# Patient Record
Sex: Female | Born: 1935 | Race: Asian | Hispanic: No | Smoking: Never smoker
Health system: Southern US, Community
[De-identification: ages and names within clinical notes are randomized; demographics above are authoritative.]

## PROBLEM LIST (undated history)

## (undated) DIAGNOSIS — Z9289 Personal history of other medical treatment: Secondary | ICD-10-CM

## (undated) DIAGNOSIS — R0789 Other chest pain: Secondary | ICD-10-CM

## (undated) DIAGNOSIS — E785 Hyperlipidemia, unspecified: Secondary | ICD-10-CM

## (undated) DIAGNOSIS — I10 Essential (primary) hypertension: Secondary | ICD-10-CM

## (undated) HISTORY — PX: DILATION AND CURETTAGE OF UTERUS: SHX78

## (undated) HISTORY — PX: TUBAL LIGATION: SHX77

## (undated) HISTORY — DX: Essential (primary) hypertension: I10

## (undated) HISTORY — DX: Personal history of other medical treatment: Z92.89

## (undated) HISTORY — DX: Hyperlipidemia, unspecified: E78.5

## (undated) HISTORY — DX: Other chest pain: R07.89

## (undated) HISTORY — PX: OTHER SURGICAL HISTORY: SHX169

---

## 2000-03-22 ENCOUNTER — Other Ambulatory Visit: Admission: RE | Admit: 2000-03-22 | Discharge: 2000-03-22 | Payer: Self-pay | Admitting: *Deleted

## 2000-03-23 ENCOUNTER — Encounter (INDEPENDENT_AMBULATORY_CARE_PROVIDER_SITE_OTHER): Payer: Self-pay | Admitting: Specialist

## 2000-03-23 ENCOUNTER — Other Ambulatory Visit: Admission: RE | Admit: 2000-03-23 | Discharge: 2000-03-23 | Payer: Self-pay | Admitting: *Deleted

## 2003-12-16 ENCOUNTER — Other Ambulatory Visit: Admission: RE | Admit: 2003-12-16 | Discharge: 2003-12-16 | Payer: Self-pay | Admitting: Internal Medicine

## 2003-12-18 ENCOUNTER — Ambulatory Visit (HOSPITAL_BASED_OUTPATIENT_CLINIC_OR_DEPARTMENT_OTHER): Admission: RE | Admit: 2003-12-18 | Discharge: 2003-12-18 | Payer: Self-pay | Admitting: Orthopedic Surgery

## 2003-12-18 ENCOUNTER — Encounter (INDEPENDENT_AMBULATORY_CARE_PROVIDER_SITE_OTHER): Payer: Self-pay | Admitting: Specialist

## 2003-12-18 ENCOUNTER — Ambulatory Visit (HOSPITAL_COMMUNITY): Admission: RE | Admit: 2003-12-18 | Discharge: 2003-12-18 | Payer: Self-pay | Admitting: Orthopedic Surgery

## 2006-03-04 ENCOUNTER — Other Ambulatory Visit: Admission: RE | Admit: 2006-03-04 | Discharge: 2006-03-04 | Payer: Self-pay | Admitting: Internal Medicine

## 2007-12-01 ENCOUNTER — Other Ambulatory Visit: Admission: RE | Admit: 2007-12-01 | Discharge: 2007-12-01 | Payer: Self-pay | Admitting: Internal Medicine

## 2007-12-28 ENCOUNTER — Ambulatory Visit (HOSPITAL_COMMUNITY): Admission: RE | Admit: 2007-12-28 | Discharge: 2007-12-28 | Payer: Self-pay | Admitting: *Deleted

## 2010-03-18 DIAGNOSIS — Z9289 Personal history of other medical treatment: Secondary | ICD-10-CM

## 2010-03-18 HISTORY — DX: Personal history of other medical treatment: Z92.89

## 2010-06-11 ENCOUNTER — Encounter: Admission: RE | Admit: 2010-06-11 | Discharge: 2010-06-11 | Payer: Self-pay | Admitting: Internal Medicine

## 2011-01-19 NOTE — Op Note (Signed)
Danielle [name], CHIHUAHUA NO.:  [id number]   MEDICAL RECORD NO.:  [id number]          PATIENT TYPE:  AMB   LOCATION:  ENDO                         FACILITY:  [facility]   PHYSICIAN:  Georgiana Spinner, M.D.    DATE OF BIRTH:  [birth year]   DATE OF PROCEDURE:  DATE OF DISCHARGE:                               OPERATIVE REPORT   PROCEDURE:  Colonoscopy   INDICATIONS:  Screening.   ANESTHESIA:  Fentanyl 50 mcg, Versed 4 mg.   PROCEDURE:  With the patient mildly sedated in the left lateral  decubitus position, the Pentax videoscopic pediatric colonoscope was  inserted in the rectum and passed under direct vision to cecum,  identified by ileocecal valve and appendiceal orifice, both of which  were photographed.  In the cecum where two AVMs.  From this point, the  colonoscope was slowly withdrawn taking circumferential views of the  colonic mucosa, stopping only then in the rectum which appeared normal  on direct and showed hemorrhoids in retroflexed view.  The endoscope was  straightened and withdrawn.  The patient's vital signs and pulse  oximeter remained stable.  The patient tolerated procedure well without  apparent complications.   FINDINGS:  AVMs of cecum.  Internal hemorrhoids, otherwise an  unremarkable exam.   PLAN:  Repeat examination in 5-10 years           ______________________________  Georgiana Spinner, M.D.     GMO/MEDQ  D:  12/28/2007  T:  12/28/2007  Job:  [id number]

## 2011-01-22 NOTE — Op Note (Signed)
[name], Danielle [name]                         ACCOUNT NO.:  [id number]   MEDICAL RECORD NO.:  [id number]                   PATIENT TYPE:  AMB   LOCATION:  DSC                                  FACILITY:  [facility]   PHYSICIAN:  Cindee Salt, M.D.                    DATE OF BIRTH:  04/13/1936   DATE OF PROCEDURE:  12/18/2003  DATE OF DISCHARGE:                                 OPERATIVE REPORT   PREOPERATIVE DIAGNOSIS:  Rupture EPL post distal radius fracture right  wrist.   POSTOPERATIVE DIAGNOSIS:  Rupture EPL post distal radius fracture right  wrist.   OPERATION:  EIP EPL transfer right wrist.   SURGEON:  Cindee Salt, M.D.   Threasa HeadsCarolyne Fiscal   ANESTHESIA:  General.   HISTORY:  The patient is a 75 year old female who suffered a nondisplaced  Colles fracture and was treated conservatively with cast immobilization.  This has gone on to heal.  However, she has had the extensor tendon to her  thumb IP joint rupture with inability to extend.   DESCRIPTION OF PROCEDURE:  The patient was brought to the operating room  where a general anesthetic was carried out without difficulty.  She was  prepped using DuraPrep in a supine position, right arm free.  A longitudinal  incision was made over the EPL tendon.  The rupture was palpable and  immediately apparent.  This was excised leaving the stump just proximal to  the extensor hood.  A transverse incision was then made over the metacarpal  phalangeal joint and carried down through the subcutaneous tissue.  The EIP  tendon was identified to the ulnar side of the extensor digitorum cominus.  This was transected distally and the stump repaired to the [facility].  This was  done with figure-of-eight 3-0 Mersilene sutures.  A separate transverse  incision was then made over the dorsum of the wrist just distal to the  Listers tubercle and this was carried down through the subcutaneous tissue.  Neurovascular structures were protected.  The dissection was  carried down to  the extensor retinaculum which was opened and partially removed to create a  tunnel.  The EIP muscle belly was identified.  A synovectomy was performed.  This allowed the tendon to be transferred from its distal stump proximal  into the more proximal wound.  A tunnel was then made over to the EPL tendon  and with a tendon passer, this was passed through the tunnel.  With a  Pulvertaft weave, this was woven through the stump of the EPL up to the  extensor hood.  Four weaves were performed and these were sutured into  position with figure-of-eight 3-0 Mersilene sutures.  The repair was done  with the wrist extended, the tendon pulled to full length, and the thumb in  a flexed position.  With volar flexion of the wrist, the thumb was entirely  pulled  up and maintained in a neutral position and a neutral position of the  wrist.  The wounds were copiously irrigated with saline.  The skin was  closed with interrupted 5-0 nylon suture.  A sterile compressive dressing  and splint to the thumb wrist was applied.  The patient tolerated the  procedure well and was taken to the recovery room for observation in  satisfactory condition.  She is discharged home to return to the [facility]  in [place] in one week on Vicodin and Keflex.                                              Cindee Salt, M.D.   GK/MEDQ  D:  12/18/2003  T:  12/18/2003  Job:  [id number]

## 2012-01-28 DIAGNOSIS — I10 Essential (primary) hypertension: Secondary | ICD-10-CM | POA: Diagnosis not present

## 2012-01-28 DIAGNOSIS — E7800 Pure hypercholesterolemia, unspecified: Secondary | ICD-10-CM | POA: Diagnosis not present

## 2012-02-02 DIAGNOSIS — Z0000 Encounter for general adult medical examination without abnormal findings: Secondary | ICD-10-CM | POA: Diagnosis not present

## 2012-02-02 DIAGNOSIS — M818 Other osteoporosis without current pathological fracture: Secondary | ICD-10-CM | POA: Diagnosis not present

## 2012-02-02 DIAGNOSIS — E7800 Pure hypercholesterolemia, unspecified: Secondary | ICD-10-CM | POA: Diagnosis not present

## 2012-02-02 DIAGNOSIS — I10 Essential (primary) hypertension: Secondary | ICD-10-CM | POA: Diagnosis not present

## 2012-02-09 DIAGNOSIS — Z1231 Encounter for screening mammogram for malignant neoplasm of breast: Secondary | ICD-10-CM | POA: Diagnosis not present

## 2012-03-01 ENCOUNTER — Other Ambulatory Visit (HOSPITAL_COMMUNITY)
Admission: RE | Admit: 2012-03-01 | Discharge: 2012-03-01 | Disposition: A | Discharge status: Home / Self Care | Payer: Medicare Other | Source: Ambulatory Visit | Attending: Internal Medicine | Admitting: Internal Medicine

## 2012-03-01 ENCOUNTER — Other Ambulatory Visit: Payer: Self-pay | Admitting: Registered Nurse

## 2012-03-01 DIAGNOSIS — Z23 Encounter for immunization: Secondary | ICD-10-CM | POA: Diagnosis not present

## 2012-03-01 DIAGNOSIS — Z1212 Encounter for screening for malignant neoplasm of rectum: Secondary | ICD-10-CM | POA: Diagnosis not present

## 2012-03-01 DIAGNOSIS — Z124 Encounter for screening for malignant neoplasm of cervix: Secondary | ICD-10-CM | POA: Diagnosis not present

## 2012-03-01 DIAGNOSIS — Z01419 Encounter for gynecological examination (general) (routine) without abnormal findings: Secondary | ICD-10-CM | POA: Diagnosis not present

## 2012-03-01 DIAGNOSIS — I10 Essential (primary) hypertension: Secondary | ICD-10-CM | POA: Diagnosis not present

## 2012-05-12 DIAGNOSIS — I10 Essential (primary) hypertension: Secondary | ICD-10-CM | POA: Diagnosis not present

## 2012-05-25 DIAGNOSIS — Z23 Encounter for immunization: Secondary | ICD-10-CM | POA: Diagnosis not present

## 2012-08-01 DIAGNOSIS — E7800 Pure hypercholesterolemia, unspecified: Secondary | ICD-10-CM | POA: Diagnosis not present

## 2012-08-01 DIAGNOSIS — M818 Other osteoporosis without current pathological fracture: Secondary | ICD-10-CM | POA: Diagnosis not present

## 2012-08-08 DIAGNOSIS — M810 Age-related osteoporosis without current pathological fracture: Secondary | ICD-10-CM | POA: Diagnosis not present

## 2012-08-08 DIAGNOSIS — E7800 Pure hypercholesterolemia, unspecified: Secondary | ICD-10-CM | POA: Diagnosis not present

## 2012-08-08 DIAGNOSIS — I10 Essential (primary) hypertension: Secondary | ICD-10-CM | POA: Diagnosis not present

## 2012-10-13 DIAGNOSIS — I10 Essential (primary) hypertension: Secondary | ICD-10-CM | POA: Diagnosis not present

## 2013-02-07 DIAGNOSIS — M810 Age-related osteoporosis without current pathological fracture: Secondary | ICD-10-CM | POA: Diagnosis not present

## 2013-02-07 DIAGNOSIS — E2839 Other primary ovarian failure: Secondary | ICD-10-CM | POA: Diagnosis not present

## 2013-02-07 DIAGNOSIS — I10 Essential (primary) hypertension: Secondary | ICD-10-CM | POA: Diagnosis not present

## 2013-02-14 DIAGNOSIS — I10 Essential (primary) hypertension: Secondary | ICD-10-CM | POA: Diagnosis not present

## 2013-02-14 DIAGNOSIS — M810 Age-related osteoporosis without current pathological fracture: Secondary | ICD-10-CM | POA: Diagnosis not present

## 2013-02-14 DIAGNOSIS — R7309 Other abnormal glucose: Secondary | ICD-10-CM | POA: Diagnosis not present

## 2013-02-14 DIAGNOSIS — E7800 Pure hypercholesterolemia, unspecified: Secondary | ICD-10-CM | POA: Diagnosis not present

## 2013-02-15 ENCOUNTER — Encounter: Payer: Self-pay | Admitting: Internal Medicine

## 2013-05-09 ENCOUNTER — Other Ambulatory Visit: Payer: Self-pay | Admitting: Internal Medicine

## 2013-05-09 NOTE — Telephone Encounter (Signed)
Rx was sent to pharmacy electronically. 

## 2013-05-14 DIAGNOSIS — I10 Essential (primary) hypertension: Secondary | ICD-10-CM | POA: Diagnosis not present

## 2013-05-14 DIAGNOSIS — R7309 Other abnormal glucose: Secondary | ICD-10-CM | POA: Diagnosis not present

## 2013-05-17 ENCOUNTER — Encounter: Payer: Self-pay | Admitting: *Deleted

## 2013-05-17 DIAGNOSIS — Z23 Encounter for immunization: Secondary | ICD-10-CM | POA: Diagnosis not present

## 2013-05-17 DIAGNOSIS — M810 Age-related osteoporosis without current pathological fracture: Secondary | ICD-10-CM | POA: Diagnosis not present

## 2013-05-17 DIAGNOSIS — I10 Essential (primary) hypertension: Secondary | ICD-10-CM | POA: Diagnosis not present

## 2013-05-17 DIAGNOSIS — E7800 Pure hypercholesterolemia, unspecified: Secondary | ICD-10-CM | POA: Diagnosis not present

## 2013-05-24 ENCOUNTER — Ambulatory Visit (INDEPENDENT_AMBULATORY_CARE_PROVIDER_SITE_OTHER): Payer: Medicare Other | Admitting: Internal Medicine

## 2013-05-24 ENCOUNTER — Encounter: Payer: Self-pay | Admitting: Internal Medicine

## 2013-05-24 VITALS — BP 122/76 | HR 78 | Ht 61.0 in | Wt 89.3 lb

## 2013-05-24 DIAGNOSIS — I10 Essential (primary) hypertension: Secondary | ICD-10-CM

## 2013-05-24 NOTE — Progress Notes (Signed)
OFFICE NOTE  Chief Complaint:  No complaints  Primary Care Physician: Pearson Grippe, MD  HPI:  Danielle [name] a pleasant 77 year old thin Asian lady who is quite active doing [employer] and many activities throughout the week. She had a complaint of chest heaviness at her last visit however that resolved and she's had no further episodes. She is continuing to be active and recently saw her primary care provider who obtained a lipid profile which was for the first time demonstrating a total cholesterol less than 200. She's had a couple more pounds weight loss but continues to be very active and her densitometry has stabilized with her activity. She denies any chest pain, palpitations or any other associated cardiac symptoms. Her blood pressure is at goal.  PMHx:  Past Medical History  Diagnosis Date  . Hypertension   . Atypical chest pain   . Dyslipidemia   . History of nuclear stress test 03/18/2010    dipyridamole; normal pattern of perfusion, low risk, normal study     Past Surgical History  Procedure Laterality Date  . Dilation and curettage of uterus      FAMHx:  Family History  Problem Relation Age of Onset  . Cancer Father   . Heart Problems Brother   . Hypertension Brother     SOCHx:   reports that she has never smoked. She does not have any smokeless tobacco history on file. She reports that she drinks about 0.6 ounces of alcohol per week. She reports that she does not use illicit drugs.  ALLERGIES:  Allergies  Allergen Reactions  . Other     Airborne    ROS: A comprehensive review of systems was negative.  HOME MEDS: Current Outpatient Prescriptions  Medication Sig Dispense Refill  . amLODipine (NORVASC) 5 MG tablet TAKE 1 TABLET BY MOUTH DAILY  90 tablet  1  . aspirin 81 MG tablet Take 81 mg by mouth daily.      Marland Kitchen b complex vitamins tablet Take 1 tablet by mouth daily.      Marland Kitchen CALCIUM PO Take by mouth 2 (two) times daily.      . Cholecalciferol  (VITAMIN D-3 PO) Take by mouth daily.      . fish oil-omega-3 fatty acids 1000 MG capsule Take 1 g by mouth 2 (two) times daily.      . Magnesium Hydroxide (MAGNESIA PO) Take by mouth at bedtime.      . Multiple Vitamins-Minerals (MULTIVITAMIN PO) Take by mouth daily.      . Pyridoxine HCl (VITAMIN B-6 PO) Take by mouth at bedtime.      . Red Yeast Rice Extract (RED YEAST RICE PO) Take by mouth 2 (two) times daily.       No current facility-administered medications for this visit.    LABS/IMAGING: No results found for this or any previous visit (from the past 48 hour(s)). No results found.  VITALS: BP 122/76  Pulse 78  Ht 5\' 1"  (1.549 m)  Wt 89 lb 4.8 oz (40.506 kg)  BMI 16.88 kg/m2  EXAM: General appearance: alert and no distress Neck: no adenopathy, no carotid bruit, no JVD, supple, symmetrical, trachea midline and thyroid not enlarged, symmetric, no tenderness/mass/nodules Lungs: clear to auscultation bilaterally Heart: regular rate and rhythm, S1, S2 normal, no murmur, click, rub or gallop Abdomen: soft, non-tender; bowel sounds normal; no masses,  no organomegaly Extremities: extremities normal, atraumatic, no cyanosis or edema Pulses: 2+ and symmetric Skin: Skin color, texture, turgor  normal. No rashes or lesions Neurologic: Grossly normal  EKG: Normal sinus rhythm at 78  ASSESSMENT: 1. Hypertension-controlled  PLAN: 1.   Danielle [name] is doing extremely well and continues to exercise. Her blood pressure is at goal on her current medications. She is having no adverse cardiac symptoms that I can identify. We'll plan on seeing her back in one year.  Chrystie Nose, MD, [facility] Attending Cardiologist [facility]  Tia Hieronymus C 05/24/2013, 10:29 AM

## 2013-11-06 DIAGNOSIS — S99919A Unspecified injury of unspecified ankle, initial encounter: Secondary | ICD-10-CM | POA: Diagnosis not present

## 2013-11-06 DIAGNOSIS — M25569 Pain in unspecified knee: Secondary | ICD-10-CM | POA: Diagnosis not present

## 2013-11-06 DIAGNOSIS — S8990XA Unspecified injury of unspecified lower leg, initial encounter: Secondary | ICD-10-CM | POA: Diagnosis not present

## 2013-11-14 ENCOUNTER — Other Ambulatory Visit: Payer: Self-pay | Admitting: Internal Medicine

## 2013-11-14 DIAGNOSIS — S86819A Strain of other muscle(s) and tendon(s) at lower leg level, unspecified leg, initial encounter: Secondary | ICD-10-CM | POA: Diagnosis not present

## 2013-11-14 DIAGNOSIS — S838X9A Sprain of other specified parts of unspecified knee, initial encounter: Secondary | ICD-10-CM | POA: Diagnosis not present

## 2013-11-14 DIAGNOSIS — M25569 Pain in unspecified knee: Secondary | ICD-10-CM | POA: Diagnosis not present

## 2013-11-14 NOTE — Telephone Encounter (Signed)
Rx was sent to pharmacy electronically. 

## 2013-12-17 DIAGNOSIS — M25569 Pain in unspecified knee: Secondary | ICD-10-CM | POA: Diagnosis not present

## 2013-12-24 DIAGNOSIS — M25569 Pain in unspecified knee: Secondary | ICD-10-CM | POA: Diagnosis not present

## 2013-12-31 DIAGNOSIS — M25569 Pain in unspecified knee: Secondary | ICD-10-CM | POA: Diagnosis not present

## 2013-12-31 DIAGNOSIS — IMO0002 Reserved for concepts with insufficient information to code with codable children: Secondary | ICD-10-CM | POA: Diagnosis not present

## 2014-01-14 DIAGNOSIS — IMO0002 Reserved for concepts with insufficient information to code with codable children: Secondary | ICD-10-CM | POA: Diagnosis not present

## 2014-01-29 DIAGNOSIS — IMO0002 Reserved for concepts with insufficient information to code with codable children: Secondary | ICD-10-CM | POA: Diagnosis not present

## 2014-01-29 DIAGNOSIS — Y9301 Activity, walking, marching and hiking: Secondary | ICD-10-CM | POA: Diagnosis not present

## 2014-01-29 DIAGNOSIS — M9420 Chondromalacia, unspecified site: Secondary | ICD-10-CM | POA: Diagnosis not present

## 2014-01-29 DIAGNOSIS — M23305 Other meniscus derangements, unspecified medial meniscus, unspecified knee: Secondary | ICD-10-CM | POA: Diagnosis not present

## 2014-01-29 DIAGNOSIS — G8918 Other acute postprocedural pain: Secondary | ICD-10-CM | POA: Diagnosis not present

## 2014-01-29 DIAGNOSIS — Y998 Other external cause status: Secondary | ICD-10-CM | POA: Diagnosis not present

## 2014-01-29 DIAGNOSIS — Y929 Unspecified place or not applicable: Secondary | ICD-10-CM | POA: Diagnosis not present

## 2014-01-29 DIAGNOSIS — M959 Acquired deformity of musculoskeletal system, unspecified: Secondary | ICD-10-CM | POA: Diagnosis not present

## 2014-01-29 DIAGNOSIS — X500XXA Overexertion from strenuous movement or load, initial encounter: Secondary | ICD-10-CM | POA: Diagnosis not present

## 2014-02-04 DIAGNOSIS — Z5189 Encounter for other specified aftercare: Secondary | ICD-10-CM | POA: Diagnosis not present

## 2014-02-06 DIAGNOSIS — Z5189 Encounter for other specified aftercare: Secondary | ICD-10-CM | POA: Diagnosis not present

## 2014-02-11 DIAGNOSIS — Z5189 Encounter for other specified aftercare: Secondary | ICD-10-CM | POA: Diagnosis not present

## 2014-02-14 DIAGNOSIS — Z5189 Encounter for other specified aftercare: Secondary | ICD-10-CM | POA: Diagnosis not present

## 2014-02-18 DIAGNOSIS — Z5189 Encounter for other specified aftercare: Secondary | ICD-10-CM | POA: Diagnosis not present

## 2014-02-21 DIAGNOSIS — Z5189 Encounter for other specified aftercare: Secondary | ICD-10-CM | POA: Diagnosis not present

## 2014-02-25 DIAGNOSIS — Z5189 Encounter for other specified aftercare: Secondary | ICD-10-CM | POA: Diagnosis not present

## 2014-02-28 DIAGNOSIS — Z5189 Encounter for other specified aftercare: Secondary | ICD-10-CM | POA: Diagnosis not present

## 2014-03-04 DIAGNOSIS — Z5189 Encounter for other specified aftercare: Secondary | ICD-10-CM | POA: Diagnosis not present

## 2014-04-10 DIAGNOSIS — I10 Essential (primary) hypertension: Secondary | ICD-10-CM | POA: Diagnosis not present

## 2014-04-10 DIAGNOSIS — M818 Other osteoporosis without current pathological fracture: Secondary | ICD-10-CM | POA: Diagnosis not present

## 2014-04-10 DIAGNOSIS — E7800 Pure hypercholesterolemia, unspecified: Secondary | ICD-10-CM | POA: Diagnosis not present

## 2014-04-10 DIAGNOSIS — M810 Age-related osteoporosis without current pathological fracture: Secondary | ICD-10-CM | POA: Diagnosis not present

## 2014-04-10 DIAGNOSIS — R7309 Other abnormal glucose: Secondary | ICD-10-CM | POA: Diagnosis not present

## 2014-04-17 DIAGNOSIS — I10 Essential (primary) hypertension: Secondary | ICD-10-CM | POA: Diagnosis not present

## 2014-04-17 DIAGNOSIS — E7800 Pure hypercholesterolemia, unspecified: Secondary | ICD-10-CM | POA: Diagnosis not present

## 2014-04-17 DIAGNOSIS — M810 Age-related osteoporosis without current pathological fracture: Secondary | ICD-10-CM | POA: Diagnosis not present

## 2014-05-14 ENCOUNTER — Encounter: Payer: Self-pay | Admitting: Internal Medicine

## 2014-05-14 ENCOUNTER — Ambulatory Visit (INDEPENDENT_AMBULATORY_CARE_PROVIDER_SITE_OTHER): Payer: Medicare Other | Admitting: Internal Medicine

## 2014-05-14 VITALS — BP 130/82 | HR 77 | Ht 60.0 in | Wt 91.6 lb

## 2014-05-14 DIAGNOSIS — I10 Essential (primary) hypertension: Secondary | ICD-10-CM | POA: Diagnosis not present

## 2014-05-14 MED ORDER — AMLODIPINE BESYLATE 5 MG PO TABS: ORAL_TABLET | ORAL | Status: DC

## 2014-05-14 NOTE — Progress Notes (Signed)
OFFICE NOTE  Chief Complaint:  No complaints  Primary Care Physician: Pearson Grippe, MD  HPI:  Danielle [name] a pleasant 78 year old thin Asian lady who is quite active doing [employer] and many activities throughout the week. She had a complaint of chest heaviness at her last visit however that resolved and she's had no further episodes. She is continuing to be active.  Unfortunately, she recently suffered a torn left meniscus in her knee. She subsequently underwent arthroscopic surgery and has had marked improvement in her exercise ability. She underwent physical therapy and is now getting back to normal. Blood pressure remains well-controlled on low-dose amlodipine.  PMHx:  Past Medical History  Diagnosis Date  . Hypertension   . Atypical chest pain   . Dyslipidemia   . History of nuclear stress test 03/18/2010    dipyridamole; normal pattern of perfusion, low risk, normal study     Past Surgical History  Procedure Laterality Date  . Dilation and curettage of uterus      FAMHx:  Family History  Problem Relation Age of Onset  . Cancer Father   . Heart Problems Brother   . Hypertension Brother     SOCHx:   reports that she has never smoked. She does not have any smokeless tobacco history on file. She reports that she drinks about .6 ounces of alcohol per week. She reports that she does not use illicit drugs.  ALLERGIES:  Allergies  Allergen Reactions  . Other     Airborne    ROS: A comprehensive review of systems was negative.  HOME MEDS: Current Outpatient Prescriptions  Medication Sig Dispense Refill  . amLODipine (NORVASC) 5 MG tablet TAKE 1 TABLET BY MOUTH DAILY  90 tablet  1  . aspirin 81 MG tablet Take 81 mg by mouth daily.      Marland Kitchen b complex vitamins tablet Take 1 tablet by mouth daily.      Marland Kitchen CALCIUM PO Take by mouth 2 (two) times daily.      . Cholecalciferol (VITAMIN D-3 PO) Take by mouth daily.      . fish oil-omega-3 fatty acids 1000 MG  capsule Take 1 g by mouth 2 (two) times daily.      . Glucosamine HCl (GLUCOSAMINE PO) Take by mouth daily.      . Magnesium Hydroxide (MAGNESIA PO) Take by mouth at bedtime.      . Multiple Vitamins-Minerals (MULTIVITAMIN PO) Take by mouth daily.      . Pyridoxine HCl (VITAMIN B-6 PO) Take by mouth at bedtime.      . Red Yeast Rice Extract (RED YEAST RICE PO) Take by mouth 2 (two) times daily.       No current facility-administered medications for this visit.    LABS/IMAGING: No results found for this or any previous visit (from the past 48 hour(s)). No results found.  VITALS: BP 130/82  Pulse 77  Ht 5' (1.524 m)  Wt 91 lb 9.6 oz (41.549 kg)  BMI 17.89 kg/m2  EXAM: General appearance: alert and no distress Neck: no adenopathy, no carotid bruit, no JVD, supple, symmetrical, trachea midline and thyroid not enlarged, symmetric, no tenderness/mass/nodules Lungs: clear to auscultation bilaterally Heart: regular rate and rhythm, S1, S2 normal, no murmur, click, rub or gallop Abdomen: soft, non-tender; bowel sounds normal; no masses,  no organomegaly Extremities: extremities normal, atraumatic, no cyanosis or edema Pulses: 2+ and symmetric Skin: Skin color, texture, turgor normal. No rashes or lesions Neurologic: Grossly  normal  EKG: Normal sinus rhythm at 77, non-specific T wave changes  ASSESSMENT: 1. Hypertension-controlled  PLAN: 1.   [name] continues to do well with good blood pressure control and low-dose amlodipine. She recently had arthroscopic knee surgery and is now well recovered. I encourage her to get back to her walking exercises. She recently also had blood work indicating excellent control of her cholesterol normal blood glucose. 179, triglycerides 61, HDL 56 and LDL 111. Plan to see her back yearly or sooner as necessary.  Chrystie Nose, MD, [facility] Attending Cardiologist [facility]  Cyril Railey C 05/14/2014, 3:12 PM

## 2014-05-14 NOTE — Patient Instructions (Signed)
Your physician wants you to follow-up in: 1 year. You will receive a reminder letter in the mail two months in advance. If you don't receive a letter, please call our office to schedule the follow-up appointment.  

## 2014-06-01 DIAGNOSIS — Z23 Encounter for immunization: Secondary | ICD-10-CM | POA: Diagnosis not present

## 2014-07-30 ENCOUNTER — Ambulatory Visit (INDEPENDENT_AMBULATORY_CARE_PROVIDER_SITE_OTHER): Payer: Medicare Other | Admitting: Family Medicine

## 2014-07-30 VITALS — BP 122/80 | HR 79 | Temp 97.3°F | Resp 16 | Ht 61.0 in | Wt 92.0 lb

## 2014-07-30 DIAGNOSIS — R350 Frequency of micturition: Secondary | ICD-10-CM

## 2014-07-30 DIAGNOSIS — R1030 Lower abdominal pain, unspecified: Secondary | ICD-10-CM

## 2014-07-30 DIAGNOSIS — R109 Unspecified abdominal pain: Secondary | ICD-10-CM

## 2014-07-30 DIAGNOSIS — R300 Dysuria: Secondary | ICD-10-CM

## 2014-07-30 LAB — POCT UA - MICROSCOPIC ONLY
Bacteria, U Microscopic: NEGATIVE
Casts, Ur, LPF, POC: NEGATIVE
Epithelial cells, urine per micros: NEGATIVE
Mucus, UA: NEGATIVE
WBC, Ur, HPF, POC: NEGATIVE
Yeast, UA: NEGATIVE

## 2014-07-30 LAB — POCT CBC
Granulocyte percent: 67 %G (ref 37–80)
HCT, POC: 40.8 % (ref 37.7–47.9)
Hemoglobin: 13.7 g/dL (ref 12.2–16.2)
Lymph, poc: 1.3 (ref 0.6–3.4)
MCH, POC: 32.2 pg — AB (ref 27–31.2)
MCHC: 33.7 g/dL (ref 31.8–35.4)
MCV: 95.8 fL (ref 80–97)
MID (cbc): 0.2 (ref 0–0.9)
MPV: 5.9 fL (ref 0–99.8)
POC Granulocyte: 3 (ref 2–6.9)
POC LYMPH PERCENT: 28.8 %L (ref 10–50)
POC MID %: 4.2 %M (ref 0–12)
Platelet Count, POC: 287 10*3/uL (ref 142–424)
RBC: 4.27 M/uL (ref 4.04–5.48)
RDW, POC: 13.3 %
WBC: 4.5 10*3/uL — AB (ref 4.6–10.2)

## 2014-07-30 LAB — POCT URINALYSIS DIPSTICK
Bilirubin, UA: NEGATIVE
Glucose, UA: NEGATIVE
Ketones, UA: NEGATIVE
Leukocytes, UA: NEGATIVE
Nitrite, UA: NEGATIVE
Protein, UA: NEGATIVE
Spec Grav, UA: <=1.005
Urobilinogen, UA: 0.2
pH, UA: 6

## 2014-07-30 LAB — COMPREHENSIVE METABOLIC PANEL
ALT: 22 U/L (ref 0–35)
AST: 29 U/L (ref 0–37)
Albumin: 4.9 g/dL (ref 3.5–5.2)
Alkaline Phosphatase: 85 U/L (ref 39–117)
BUN: 14 mg/dL (ref 6–23)
CO2: 27 mEq/L (ref 19–32)
Calcium: 9.5 mg/dL (ref 8.4–10.5)
Chloride: 101 mEq/L (ref 96–112)
Creat: 0.68 mg/dL (ref 0.50–1.10)
Glucose, Bld: 107 mg/dL — ABNORMAL HIGH (ref 70–99)
Potassium: 4.7 mEq/L (ref 3.5–5.3)
Sodium: 137 mEq/L (ref 135–145)
Total Bilirubin: 0.6 mg/dL (ref 0.2–1.2)
Total Protein: 7.5 g/dL (ref 6.0–8.3)

## 2014-07-30 NOTE — Progress Notes (Signed)
Subjective: 78 year old lady who is here with about a 5 day history of some discomfort and cramping in her low abdomen. She has not noticed any odor or cloudiness of her urine. She has not had any vaginal bleeding. She had a little low back pain yesterday. She has not had any documented fevers or felt particularly chilled. She only rarely has had UTIs, probably over 10 or 15 years ago. She is retired. She does have nocturia for 4- 5 times  but that is not anything either.  Objective: Healthy-appearing lady. No CVA tenderness. Abdomen soft with mild suprapubic tenderness  Results for orders placed or performed in visit on 07/30/14  POCT UA - Microscopic Only  Result Value Ref Range   WBC, Ur, HPF, POC neg    RBC, urine, microscopic 0-1    Bacteria, U Microscopic neg    Mucus, UA neg    Epithelial cells, urine per micros neg    Crystals, Ur, HPF, POC ne    Casts, Ur, LPF, POC neg    Yeast, UA neg   POCT urinalysis dipstick  Result Value Ref Range   Color, UA pale yellow    Clarity, UA clear    Glucose, UA neg    Bilirubin, UA neg    Ketones, UA neg    Spec Grav, UA <=1.005    Blood, UA trace-intact    pH, UA 6.0    Protein, UA neg    Urobilinogen, UA 0.2    Nitrite, UA neg    Leukocytes, UA Negative    With normal urinalysis decided to go ahead and do a pelvic exam. Normal external genitalia. Vaginal mucosa and cervix appear normal. Digital exam only was would allow one finger. Could not feel any masses or tenderness. There is a little stool up in the colon the left lower quadrant but did not feel like it was an ovarian or pelvic mass.  We'll check CBC and C met. If she is not doing better in the next few days she will need further imaging, probably a pelvic ultrasound or a CT scan of abdomen and pelvis.  Results for orders placed or performed in visit on 07/30/14  POCT UA - Microscopic Only  Result Value Ref Range   WBC, Ur, HPF, POC neg    RBC, urine, microscopic 0-1    Bacteria, U Microscopic neg    Mucus, UA neg    Epithelial cells, urine per micros neg    Crystals, Ur, HPF, POC ne    Casts, Ur, LPF, POC neg    Yeast, UA neg   POCT urinalysis dipstick  Result Value Ref Range   Color, UA pale yellow    Clarity, UA clear    Glucose, UA neg    Bilirubin, UA neg    Ketones, UA neg    Spec Grav, UA <=1.005    Blood, UA trace-intact    pH, UA 6.0    Protein, UA neg    Urobilinogen, UA 0.2    Nitrite, UA neg    Leukocytes, UA Negative   POCT CBC  Result Value Ref Range   WBC 4.5 (A) 4.6 - 10.2 K/uL   Lymph, poc 1.3 0.6 - 3.4   POC LYMPH PERCENT 28.8 10 - 50 %L   MID (cbc) 0.2 0 - 0.9   POC MID % 4.2 0 - 12 %M   POC Granulocyte 3.0 2 - 6.9   Granulocyte percent 67.0 37 - 80 %G  RBC 4.27 4.04 - 5.48 M/uL   Hemoglobin 13.7 12.2 - 16.2 g/dL   HCT, POC 40.8 37.7 - 47.9 %   MCV 95.8 80 - 97 fL   MCH, POC 32.2 (A) 27 - 31.2 pg   MCHC 33.7 31.8 - 35.4 g/dL   RDW, POC 13.3 %   Platelet Count, POC 287 142 - 424 K/uL   MPV 5.9 0 - 99.8 fL

## 2014-07-30 NOTE — Patient Instructions (Signed)
If pain gets specifically worse we should reassess you and probably will need to get either an ultrasound or a CT scan of the abdomen. However, as of now, no specific etiology is found for your pain. I would suggest that even though your bowels act every day, take a dose of MiraLAX for a day or 2 or 3 to get sure stools loosened up a little bit and see if that helps calm things down. You can get pain just from a little stool being backup continue even when the bowels are acting regularly.  Take ibuprofen or Tylenol as needed for pain.

## 2014-08-16 DIAGNOSIS — H52203 Unspecified astigmatism, bilateral: Secondary | ICD-10-CM | POA: Diagnosis not present

## 2015-04-29 ENCOUNTER — Other Ambulatory Visit: Payer: Self-pay | Admitting: Internal Medicine

## 2015-04-29 NOTE — Telephone Encounter (Signed)
REFILL 

## 2015-05-08 ENCOUNTER — Encounter: Payer: Self-pay | Admitting: Internal Medicine

## 2015-05-29 ENCOUNTER — Encounter: Payer: Self-pay | Admitting: Internal Medicine

## 2015-05-29 ENCOUNTER — Ambulatory Visit (INDEPENDENT_AMBULATORY_CARE_PROVIDER_SITE_OTHER): Payer: Medicare Other | Admitting: Internal Medicine

## 2015-05-29 VITALS — BP 130/78 | HR 82 | Ht 60.0 in | Wt 91.0 lb

## 2015-05-29 DIAGNOSIS — I10 Essential (primary) hypertension: Secondary | ICD-10-CM

## 2015-05-29 NOTE — Progress Notes (Signed)
OFFICE NOTE  Chief Complaint:  No complaints  Primary Care Physician: Pearson Grippe, MD  HPI:  Danielle [name] a pleasant 79 year old thin Asian lady who is quite active doing [employer] and many activities throughout the week. She had a complaint of chest heaviness at her last visit however that resolved and she's had no further episodes. She is continuing to be active.  Unfortunately, she recently suffered a torn left meniscus in her knee. She subsequently underwent arthroscopic surgery and has had marked improvement in her exercise ability. She underwent physical therapy and is now getting back to normal. Blood pressure remains well-controlled on low-dose amlodipine.   I had the pleasure of seeing Danielle [name] back in the office today.  She continues to do extremely well. She exercises regularly and denies any chest pain or worsening shortness of breath. Blood pressures been well controlled.  I reviewed recent laboratory work from her primary care provider which shows excellent cholesterol control and otherwise all labs are within normal limits.  PMHx:  Past Medical History  Diagnosis Date  . Hypertension   . Atypical chest pain   . Dyslipidemia   . History of nuclear stress test 03/18/2010    dipyridamole; normal pattern of perfusion, low risk, normal study     Past Surgical History  Procedure Laterality Date  . Dilation and curettage of uterus      FAMHx:  Family History  Problem Relation Age of Onset  . Cancer Father   . Heart Problems Brother   . Hypertension Brother     SOCHx:   reports that she has never smoked. She does not have any smokeless tobacco history on file. She reports that she drinks about 0.6 oz of alcohol per week. She reports that she does not use illicit drugs.  ALLERGIES:  Allergies  Allergen Reactions  . Other     Airborne    ROS: A comprehensive review of systems was negative.  HOME MEDS: Current Outpatient Prescriptions    Medication Sig Dispense Refill  . amLODipine (NORVASC) 5 MG tablet TAKE 1 TABLET BY MOUTH DAILY 90 tablet 0  . aspirin 81 MG tablet Take 81 mg by mouth daily.    Marland Kitchen b complex vitamins tablet Take 1 tablet by mouth daily.    Marland Kitchen CALCIUM PO Take by mouth 2 (two) times daily.    . Cholecalciferol (VITAMIN D-3 PO) Take by mouth daily.    . fish oil-omega-3 fatty acids 1000 MG capsule Take 1 g by mouth 2 (two) times daily.    . Magnesium Hydroxide (MAGNESIA PO) Take by mouth at bedtime.    . Multiple Vitamins-Minerals (MULTIVITAMIN PO) Take by mouth daily.    . Pyridoxine HCl (VITAMIN B-6 PO) Take by mouth at bedtime.    . Red Yeast Rice Extract (RED YEAST RICE PO) Take by mouth 2 (two) times daily.     No current facility-administered medications for this visit.    LABS/IMAGING: No results found for this or any previous visit (from the past 48 hour(s)). No results found.  VITALS: BP 130/78 mmHg  Pulse 82  Ht 5' (1.524 m)  Wt 91 lb (41.277 kg)  BMI 17.77 kg/m2  EXAM: General appearance: alert and no distress Neck: no adenopathy, no carotid bruit, no JVD, supple, symmetrical, trachea midline and thyroid not enlarged, symmetric, no tenderness/mass/nodules Lungs: clear to auscultation bilaterally Heart: regular rate and rhythm, S1, S2 normal, no murmur, click, rub or gallop Abdomen: soft, non-tender; bowel sounds  normal; no masses,  no organomegaly Extremities: extremities normal, atraumatic, no cyanosis or edema Pulses: 2+ and symmetric Skin: Skin color, texture, turgor normal. No rashes or lesions Neurologic: Grossly normal  EKG: Normal sinus rhythm at 82  ASSESSMENT: 1. Hypertension-controlled  PLAN: 1.   Danielle [name] continues to do well with good blood pressure control and low-dose amlodipine.  She continues to exercise regularly and is completely asymptomatic. We'll continue with her current regimen otherwise plan to see her back annually or sooner as necessary.  Chrystie Nose, MD, [facility] Attending Cardiologist [facility]   Chrystie Nose 05/29/2015, 1:42 PM

## 2015-05-29 NOTE — Patient Instructions (Signed)
Your physician recommends that you schedule a follow-up appointment in: ONE YEAR 

## 2015-08-05 ENCOUNTER — Other Ambulatory Visit: Payer: Self-pay | Admitting: Internal Medicine

## 2015-08-06 NOTE — Telephone Encounter (Signed)
Rx request sent to pharmacy.  

## 2015-11-26 ENCOUNTER — Other Ambulatory Visit: Payer: Self-pay | Admitting: Internal Medicine

## 2015-11-26 NOTE — Telephone Encounter (Signed)
Ann( Dr. Selena BattenKim Office) is calling

## 2015-11-26 NOTE — Telephone Encounter (Signed)
Ann from Dr. Elmyra RicksKim's office called in on patient's behalf after patient was notified to call her PCP by someone in our practice. Patient told Dewayne Hatchnn she had called in to our office and wanted to see Dr. Rennis GoldenHilty today for elevated BP and chest discomfort but was instead just given an appointment with Franky [place]uke, [place] on Friday March 24th -- the message was never triaged. Dewayne Hatchnn states that the patient was "exasperated" regarding the communication from our office. Patient is not currently on the line for direct contact. Informed Ann that the appt notes for the 3/24 appt only state "BP elevated"   Called patient - patient states she is taking generic of amlodipine - her Rx from [facility] looks different than previous [facility] (insurance dictated a change in her pharmacy) -- patient felt funny with this different amlodipine - heart was pounding -- she took the med for 12 days and then stopped, states her BP was going up -- BP was 160s/90s (off amlodipine) -- patient drank beet juice to lower BP -- patient had an old diuretic from years past that she took yesterday (hctz 12.5mg ) -- patient found some leftovers of her old amlodipine - she states she will take this  Patient asked if she [facility] - advised I will send in for her - she is aware she may pay out of pocket since pharmacy is not in her network with insurance Advised patient to resume amlodipine and not take old hctz prescription  Informed her I will send the message to the MD for review.

## 2015-11-26 NOTE — Telephone Encounter (Signed)
Rx request sent to pharmacy.  

## 2015-11-27 NOTE — Telephone Encounter (Signed)
Thanks .Marland Kitchen. We need to have Amy look into how Danielle [name] was handled over the phone. Agree with her getting meds from her old pharmacy, but she should be seen if BP is up. Please reassure her that Franky MachoLuke will take care of her.  Dr. HRexene Edison

## 2015-11-27 NOTE — Telephone Encounter (Signed)
Patient cancelled her 11/28/15 appt with Franky MachoLuke, [place].

## 2015-11-28 ENCOUNTER — Ambulatory Visit: Payer: Medicare Other | Admitting: Cardiology

## 2015-12-02 ENCOUNTER — Encounter: Payer: Self-pay | Admitting: Internal Medicine

## 2015-12-02 ENCOUNTER — Ambulatory Visit: Payer: Medicare Other | Admitting: Internal Medicine

## 2015-12-02 ENCOUNTER — Ambulatory Visit (INDEPENDENT_AMBULATORY_CARE_PROVIDER_SITE_OTHER): Payer: Medicare Other | Admitting: Internal Medicine

## 2015-12-02 VITALS — BP 144/82 | HR 78 | Ht 61.0 in | Wt 92.0 lb

## 2015-12-02 DIAGNOSIS — R079 Chest pain, unspecified: Secondary | ICD-10-CM

## 2015-12-02 DIAGNOSIS — R072 Precordial pain: Secondary | ICD-10-CM

## 2015-12-02 DIAGNOSIS — R002 Palpitations: Secondary | ICD-10-CM | POA: Diagnosis not present

## 2015-12-02 DIAGNOSIS — R0989 Other specified symptoms and signs involving the circulatory and respiratory systems: Secondary | ICD-10-CM | POA: Insufficient documentation

## 2015-12-02 DIAGNOSIS — I10 Essential (primary) hypertension: Secondary | ICD-10-CM

## 2015-12-02 NOTE — Telephone Encounter (Signed)
Patient is scheduled to see Dr. Rennis GoldenHilty 12/02/15 @ 1030am

## 2015-12-02 NOTE — Patient Instructions (Signed)
Your physician has requested that you have an exercise tolerance test. For further information please visit [email or web address]. Please also follow instruction sheet, as given.  Your physician has recommended that you wear an event monitor for 1 week. Event monitors are medical devices that record the heart's electrical activity. Doctors most often us these monitors to diagnose arrhythmias. Arrhythmias are problems with the speed or rhythm of the heartbeat. The monitor is a small, portable device. You can wear one while you do your normal daily activities. This is usually used to diagnose what is causing palpitations/syncope (passing out).  Your physician recommends that you schedule a follow-up appointment after your testing.

## 2015-12-02 NOTE — Progress Notes (Signed)
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 She had a complaint of chest heaviness at her last visit however that resolved and she's had no further episodes. She is continuing to be active.  Unfortunately, she recently suffered a torn left meniscus in her knee. She subsequently underwent arthroscopic surgery and has had marked improvement in her exercise ability. She underwent physical therapy and is now getting back to normal. Blood pressure remains well-controlled on low-dose amlodipine.  I had the pleasure of seeing Danielle [name] back in the office today.  She continues to do extremely well. She exercises regularly and denies any chest pain or worsening shortness of breath. Blood pressures been well controlled.  I reviewed recent laboratory work from her primary care provider which shows excellent cholesterol control and otherwise all labs are within normal limits.  At the pleasure see Danielle [name] here today in follow-up. She recent called in the office and noted that she's had some labile blood pressures. [facility]. When they filled her prescription she noted that she had a different generic for amlodipine. She said after taking that for 8-10 doses she felt some tightness in her chest, palpitations and noted her blood pressure gone up to 180. She then stopped the medicine and took some of her old prescription that she found. She called the office and requested a prescription for her old medication. She then switched to that with an improvement in blood pressure however she still has some symptoms and doesn't feel quite as well as she did several weeks ago. She reported the palpitations felt like a hard pounding in her chest. Her last stress test was in 2011 and negative  for ischemia.  PMHx:  Past Medical History  Diagnosis Date  . Hypertension   . Atypical chest pain   . Dyslipidemia   . History of nuclear stress test 03/18/2010    dipyridamole; normal pattern of perfusion, low risk, normal study     Past Surgical History  Procedure Laterality Date  . Dilation and curettage of uterus      FAMHx:  Family History  Problem Relation Age of Onset  . Cancer Father   . Heart Problems Brother   . Hypertension Brother     SOCHx:   reports that she has never smoked. She does not have any smokeless tobacco history on file. She reports that she drinks about 0.6 oz of alcohol per week. She reports that she does not use illicit drugs.  ALLERGIES:  Allergies  Allergen Reactions  . Other     Airborne    ROS: Pertinent items noted in HPI and remainder of comprehensive ROS otherwise negative.  HOME MEDS: Current Outpatient Prescriptions  Medication Sig Dispense Refill  . amLODipine (NORVASC) 5 MG tablet TAKE 1 TABLET BY MOUTH DAILY 90 tablet 1  . aspirin 81 MG tablet Take 81 mg by mouth daily.    Marland Kitchen. b complex vitamins tablet Take 1 tablet by mouth daily.    Marland Kitchen. CALCIUM PO Take by mouth 2 (two) times daily.    . Cholecalciferol (VITAMIN D-3 PO) Take by mouth daily.    . fish oil-omega-3 fatty acids 1000 MG capsule Take 1 g by mouth 2 (two) times daily.    . Magnesium Hydroxide (MAGNESIA PO)  Take by mouth at bedtime.    . Multiple Vitamins-Minerals (MULTIVITAMIN PO) Take by mouth daily.    . Pyridoxine HCl (VITAMIN B-6 PO) Take by mouth at bedtime.    . Red Yeast Rice Extract (RED YEAST RICE PO) Take by mouth 2 (two) times daily.     No current facility-administered medications for this visit.    LABS/IMAGING: No results found for this or any previous visit (from the past 48 hour(s)). No results found.  VITALS: BP 144/82 mmHg  Pulse 78  Ht  (1.549 m)  Wt 92 lb (41.731 kg)  BMI 17.39 kg/m2  EXAM: General appearance: alert and no  distress Neck: no adenopathy, no carotid bruit, no JVD, supple, symmetrical, trachea midline and thyroid not enlarged, symmetric, no tenderness/mass/nodules Lungs: clear to auscultation bilaterally Heart: regular rate and rhythm, S1, S2 normal, no murmur, click, rub or gallop Abdomen: soft, non-tender; bowel sounds normal; no masses,  no organomegaly Extremities: extremities normal, atraumatic, no cyanosis or edema Pulses: 2+ and symmetric Skin: Skin color, texture, turgor normal. No rashes or lesions Neurologic: Grossly normal  EKG: Normal sinus rhythm at 79  ASSESSMENT: 1. Chest pain 2. Palpitations 3. Hypertension-controlled  PLAN: 1.   Danielle [name] had an unexplained episode where her blood pressure went up and she felt some chest pain and palpitations. She attributed this to changing generic medications. Seems kind of unusual but I guess could be a reaction to some of the fillers in the medication. She since gone [facility] which actually is [facility] than [facility] prescription from [facility]. I'm concerned that this event may been related to something else other than change in medication. She's not had a coronary artery assessment and in number of years and recommend an exercise treadmill stress test. This also could have been related to palpitations or perhaps something like atrial fibrillation. I like to place a one-week monitor CV can pick up on any arrhythmias. For now will continue her current dose of amlodipine. Blood pressure is high normal today. Plan to see her back in follow-up in a few weeks.  Chrystie Nose, MD, [facility] Attending Cardiologist [facility]  [facility] 12/02/2015, 1:04 PM

## 2015-12-05 ENCOUNTER — Ambulatory Visit: Payer: Medicare Other | Admitting: Internal Medicine

## 2015-12-23 ENCOUNTER — Telehealth (HOSPITAL_COMMUNITY): Payer: Self-pay

## 2015-12-23 NOTE — Telephone Encounter (Signed)
Encounter complete. 

## 2015-12-25 ENCOUNTER — Ambulatory Visit (HOSPITAL_COMMUNITY)
Admission: RE | Admit: 2015-12-25 | Discharge: 2015-12-25 | Disposition: A | Discharge status: Home / Self Care | Payer: Medicare Other | Source: Ambulatory Visit | Attending: Internal Medicine | Admitting: Internal Medicine

## 2015-12-25 DIAGNOSIS — I10 Essential (primary) hypertension: Secondary | ICD-10-CM | POA: Diagnosis not present

## 2015-12-25 DIAGNOSIS — R002 Palpitations: Secondary | ICD-10-CM | POA: Insufficient documentation

## 2015-12-25 DIAGNOSIS — R079 Chest pain, unspecified: Secondary | ICD-10-CM | POA: Insufficient documentation

## 2015-12-25 DIAGNOSIS — R0989 Other specified symptoms and signs involving the circulatory and respiratory systems: Secondary | ICD-10-CM

## 2015-12-25 LAB — EXERCISE TOLERANCE TEST
CHL CUP STRESS STAGE 1 GRADE: 0 %
CHL CUP STRESS STAGE 1 HR: 78 {beats}/min
CHL CUP STRESS STAGE 2 SPEED: 1 mph
CHL CUP STRESS STAGE 3 SPEED: 1 mph
CHL CUP STRESS STAGE 4 DBP: 87 mmHg
CHL CUP STRESS STAGE 4 GRADE: 10 %
CHL CUP STRESS STAGE 4 SPEED: 1.7 mph
CHL CUP STRESS STAGE 5 GRADE: 12 %
CHL CUP STRESS STAGE 5 SPEED: 2.5 mph
CHL CUP STRESS STAGE 6 GRADE: 0 %
CHL CUP STRESS STAGE 6 HR: 123 {beats}/min
CHL CUP STRESS STAGE 6 SPEED: 0 mph
CHL CUP STRESS STAGE 7 DBP: 85 mmHg
CHL CUP STRESS STAGE 7 GRADE: 0 %
CHL CUP STRESS STAGE 7 HR: 83 {beats}/min
CHL CUP STRESS STAGE 7 SPEED: 0 mph
Estimated workload: 7 METS
Exercise duration (min): 5 min
MPHR: 142 {beats}/min
Peak BP: 198 mmHg
Peak HR: 139 {beats}/min
Percent HR: 99 %
Percent of predicted max HR: 97 %
RPE: 16
Rest HR: 81 {beats}/min
Stage 1 DBP: 101 mmHg
Stage 1 SBP: 158 mmHg
Stage 1 Speed: 0 mph
Stage 2 Grade: 0 %
Stage 2 HR: 88 {beats}/min
Stage 3 Grade: 0.1 %
Stage 3 HR: 88 {beats}/min
Stage 4 HR: 121 {beats}/min
Stage 4 SBP: 187 mmHg
Stage 5 DBP: 83 mmHg
Stage 5 HR: 139 {beats}/min
Stage 5 SBP: 198 mmHg
Stage 6 DBP: 81 mmHg
Stage 6 SBP: 202 mmHg
Stage 7 SBP: 142 mmHg

## 2015-12-29 ENCOUNTER — Ambulatory Visit (INDEPENDENT_AMBULATORY_CARE_PROVIDER_SITE_OTHER): Payer: Medicare Other

## 2015-12-29 DIAGNOSIS — R002 Palpitations: Secondary | ICD-10-CM | POA: Diagnosis not present

## 2016-01-13 ENCOUNTER — Telehealth: Payer: Self-pay | Admitting: Internal Medicine

## 2016-01-13 NOTE — Telephone Encounter (Signed)
New message   Pt wants rn to call her about results of her monitor

## 2016-01-13 NOTE — Telephone Encounter (Signed)
Patient called with monitor results - in EPIC  Patient scheduled to see Dr. Rennis GoldenHilty 01/23/16

## 2016-01-23 ENCOUNTER — Ambulatory Visit: Payer: Medicare Other | Admitting: Internal Medicine

## 2016-04-20 DIAGNOSIS — H52223 Regular astigmatism, bilateral: Secondary | ICD-10-CM | POA: Diagnosis not present

## 2016-04-29 DIAGNOSIS — I10 Essential (primary) hypertension: Secondary | ICD-10-CM | POA: Diagnosis not present

## 2016-04-29 DIAGNOSIS — M810 Age-related osteoporosis without current pathological fracture: Secondary | ICD-10-CM | POA: Diagnosis not present

## 2016-04-29 DIAGNOSIS — E559 Vitamin D deficiency, unspecified: Secondary | ICD-10-CM | POA: Diagnosis not present

## 2016-05-05 DIAGNOSIS — J329 Chronic sinusitis, unspecified: Secondary | ICD-10-CM | POA: Diagnosis not present

## 2016-05-05 DIAGNOSIS — E7800 Pure hypercholesterolemia, unspecified: Secondary | ICD-10-CM | POA: Diagnosis not present

## 2016-05-05 DIAGNOSIS — I10 Essential (primary) hypertension: Secondary | ICD-10-CM | POA: Diagnosis not present

## 2016-05-05 DIAGNOSIS — Z23 Encounter for immunization: Secondary | ICD-10-CM | POA: Diagnosis not present

## 2016-05-05 DIAGNOSIS — Z0000 Encounter for general adult medical examination without abnormal findings: Secondary | ICD-10-CM | POA: Diagnosis not present

## 2016-05-11 ENCOUNTER — Ambulatory Visit (INDEPENDENT_AMBULATORY_CARE_PROVIDER_SITE_OTHER): Payer: Medicare Other | Admitting: Internal Medicine

## 2016-05-26 ENCOUNTER — Other Ambulatory Visit: Payer: Self-pay | Admitting: Internal Medicine

## 2016-06-11 ENCOUNTER — Encounter: Payer: Self-pay | Admitting: Internal Medicine

## 2016-06-11 ENCOUNTER — Ambulatory Visit (INDEPENDENT_AMBULATORY_CARE_PROVIDER_SITE_OTHER): Payer: Medicare Other | Admitting: Internal Medicine

## 2016-06-11 VITALS — BP 125/76 | HR 78 | Ht 59.0 in | Wt 88.0 lb

## 2016-06-11 DIAGNOSIS — I10 Essential (primary) hypertension: Secondary | ICD-10-CM

## 2016-06-11 DIAGNOSIS — R002 Palpitations: Secondary | ICD-10-CM

## 2016-06-11 DIAGNOSIS — R072 Precordial pain: Secondary | ICD-10-CM

## 2016-06-11 NOTE — Progress Notes (Signed)
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 She had a complaint of chest heaviness at her last visit however that resolved and she's had no further episodes. She is continuing to be active.  Unfortunately, she recently suffered a torn left meniscus in her knee. She subsequently underwent arthroscopic surgery and has had marked improvement in her exercise ability. She underwent physical therapy and is now getting back to normal. Blood pressure remains well-controlled on low-dose amlodipine.  I had the pleasure of seeing Mrs. Nobis back in the office today.  She continues to do extremely well. She exercises regularly and denies any chest pain or worsening shortness of breath. Blood pressures been well controlled.  I reviewed recent laboratory work from her primary care provider which shows excellent cholesterol control and otherwise all labs are within normal limits.  At the pleasure see Mrs. Januszewski here today in follow-up. She recent called in the office and noted that she's had some labile blood pressures. [facility]. When they filled her prescription she noted that she had a different generic for amlodipine. She said after taking that for 8-10 doses she felt some tightness in her chest, palpitations and noted her blood pressure gone up to 180. She then stopped the medicine and took some of her old prescription that she found. She called the office and requested a prescription for her old medication. She then switched to that with an improvement in blood pressure however she still has some symptoms and doesn't feel quite as well as she did several weeks ago. She reported the palpitations felt like a hard pounding in her chest. Her last stress test was in 2011 and negative  for ischemia.  06/11/2016  Mrs. Neas returns today for follow-up. In April she had some palpitations and discomfort in her cst. She underwent a repea ischemia. She wore a monitor which showed some occasional PACs and PVCs but no A. Fib or other arrhythmias. [facility] and has noted no problems with her generic amlodipine. Blood pressure is well-controlled today.  PMHx:  Past Medical History:  Diagnosis Date  . Atypical chest pain   . Dyslipidemia   . History of nuclear stress test 03/18/2010   dipyridamole; normal pattern of perfusion, low risk, normal study   . Hypertension     Past Surgical History:  Procedure Laterality Date  . DILATION AND CURETTAGE OF UTERUS      FAMHx:  Family History  Problem Relation Age of Onset  . Cancer Father   . Heart Problems Brother   . Hypertension Brother     SOCHx:   reports that she has never smoked. She does not have any smokeless tobacco history on file. She reports that she drinks about 0.6 oz of alcohol per week . She reports that she does not use drugs.  ALLERGIES:  Allergies  Allergen Reactions  . Other     Airborne    ROS: Pertinent items noted in HPI and remainder of comprehensive ROS otherwise negative.  HOME MEDS: Current Outpatient Prescriptions  Medication Sig Dispense Refill  . amLODipine (NORVASC) 5 MG tablet TAKE 1 TABLET BY MOUTH DAILY 90 tablet 1  . aspirin 81 MG tablet Take 81 mg by mouth daily.    .Marland Kitchen  b complex vitamins tablet Take 1 tablet by mouth daily.    Marland Kitchen CALCIUM PO Take by mouth 2 (two) times daily.    . Cholecalciferol (VITAMIN D-3 PO) Take by mouth daily.    . fish oil-omega-3 fatty acids 1000 MG capsule Take 1 g by mouth 2 (two) times daily.    . Magnesium Hydroxide (MAGNESIA PO) Take by mouth at bedtime.    . Multiple Vitamins-Minerals (MULTIVITAMIN PO) Take by mouth daily.    . Pyridoxine HCl (VITAMIN B-6 PO) Take by mouth at bedtime.    . Red Yeast Rice Extract (RED YEAST RICE  PO) Take by mouth 2 (two) times daily.     No current facility-administered medications for this visit.     LABS/IMAGING: No results found for this or any previous visit (from the past 48 hour(s)). No results found.  VITALS: BP 125/76   Pulse 78   Ht 4\' 11"  (1.499 m)   Wt 88 lb (39.9 kg)   BMI 17.77 kg/m   EXAM: General appearance: alert and no distress Neck: no adenopathy, no carotid bruit, no JVD, supple, symmetrical, trachea midline and thyroid not enlarged, symmetric, no tenderness/mass/nodules Lungs: clear to auscultation bilaterally Heart: regular rate and rhythm, S1, S2 normal, no murmur, click, rub or gallop Abdomen: soft, non-tender; bowel sounds normal; no masses,  no organomegaly Extremities: extremities normal, atraumatic, no cyanosis or edema Pulses: 2+ and symmetric Skin: Skin color, texture, turgor normal. No rashes or lesions Neurologic: Grossly normal  EKG: Normal sinus rhythm at 78, minimal voltage criteria for LVH  ASSESSMENT: 1. Chest pain - low risk GXT 12/2015 2. Palpitations - occasional PAC's/PVC's 3. Hypertension-controlled  PLAN: 1.   Mrs. Brazel is doing well without palpitations. Her blood pressure is at goal. She's had no further chest pain and had a low risk exercise treadmill stress test earlier this year. Overall she's doing well and I would recommend follow-up annually or sooner as necessary.  Chrystie Nose, MD, [facility] Attending Cardiologist [facility]  Chrystie Nose 06/11/2016, 9:49 AM

## 2016-06-11 NOTE — Patient Instructions (Signed)

## 2016-10-28 DIAGNOSIS — I10 Essential (primary) hypertension: Secondary | ICD-10-CM | POA: Diagnosis not present

## 2016-10-28 DIAGNOSIS — R739 Hyperglycemia, unspecified: Secondary | ICD-10-CM | POA: Diagnosis not present

## 2016-11-04 DIAGNOSIS — R739 Hyperglycemia, unspecified: Secondary | ICD-10-CM | POA: Diagnosis not present

## 2016-11-04 DIAGNOSIS — E7800 Pure hypercholesterolemia, unspecified: Secondary | ICD-10-CM | POA: Diagnosis not present

## 2016-11-04 DIAGNOSIS — Z0000 Encounter for general adult medical examination without abnormal findings: Secondary | ICD-10-CM | POA: Diagnosis not present

## 2016-11-04 DIAGNOSIS — I10 Essential (primary) hypertension: Secondary | ICD-10-CM | POA: Diagnosis not present

## 2016-11-04 DIAGNOSIS — K047 Periapical abscess without sinus: Secondary | ICD-10-CM | POA: Diagnosis not present

## 2016-11-18 ENCOUNTER — Other Ambulatory Visit: Payer: Self-pay | Admitting: Internal Medicine

## 2016-11-20 ENCOUNTER — Other Ambulatory Visit: Payer: Self-pay | Admitting: Internal Medicine

## 2017-06-11 ENCOUNTER — Ambulatory Visit (INDEPENDENT_AMBULATORY_CARE_PROVIDER_SITE_OTHER): Payer: Medicare Other | Admitting: Family Medicine

## 2017-06-11 ENCOUNTER — Encounter: Payer: Self-pay | Admitting: Family Medicine

## 2017-06-11 VITALS — BP 124/70 | HR 73 | Temp 98.6°F | Resp 16 | Ht 60.0 in | Wt 86.4 lb

## 2017-06-11 DIAGNOSIS — J0101 Acute recurrent maxillary sinusitis: Secondary | ICD-10-CM

## 2017-06-11 MED ORDER — CHLORHEXIDINE GLUCONATE 0.12 % MT SOLN: 15.0000 mL | Freq: Every day | OROMUCOSAL | 1 refills | Status: DC

## 2017-06-11 MED ORDER — LEVOFLOXACIN 500 MG PO TABS: 500.0000 mg | ORAL_TABLET | Freq: Every day | ORAL | 0 refills | Status: DC

## 2017-06-11 NOTE — Patient Instructions (Signed)

## 2017-06-11 NOTE — Progress Notes (Signed)
Subjective:  By signing my name below, I, Stann Ore, attest that this documentation has been prepared under the direction and in the presence of Norberto Sorenson, MD. Electronically Signed: Stann Ore, Scribe. 06/11/2017 , 11:22 AM .  Patient was seen in Room 1 .   Patient ID: Danielle [name], female    DOB: 02/09/1936, 81 y.o.   MRN: [id number] Chief Complaint  Patient presents with  . Sinus Problem    possible due to root canal   HPI Danielle [name] is a 81 y.o. female who presents to Primary Care at [facility] complaining of sinus problem (R>L) with foul odor/smell and some pain ongoing since her root canal done 2 years ago. She believes her root canal was done too close to her sinus cavity, and her PCP, Dr. Selena Batten, prescribed amoxicillin for about 3 weeks. She was also referred to ENT over a year ago, who only prescribed her a stronger antibiotic, which she doesn't recall what it was. She has used neti pot with temporary relief. When she has the occasional tooth pain, she applies oral gel to the area. She hasn't tried antibiotic mouthwash before. She denies knowledge of antibiotic allergies. Her last antibiotic dose was about a month ago. She denies any change in taste, fever, chills or sore throat.   Past Medical History:  Diagnosis Date  . Atypical chest pain   . Dyslipidemia   . History of nuclear stress test 03/18/2010   dipyridamole; normal pattern of perfusion, low risk, normal study   . Hypertension    Prior to Admission medications   Medication Sig Start Date End Date Taking? Authorizing Provider  amLODipine (NORVASC) 5 MG tablet Take 1 tablet (5 mg total) by mouth daily. 11/18/16   Hilty, Lisette Abu, MD  amLODipine (NORVASC) 5 MG tablet TAKE 1 TABLET BY MOUTH DAILY 11/22/16   Hilty, Lisette Abu, MD  aspirin 81 MG tablet Take 81 mg by mouth daily.    [provider]  b complex vitamins tablet Take 1 tablet by mouth daily.    [provider]  CALCIUM PO Take by mouth 2  (two) times daily.    [provider]  Cholecalciferol (VITAMIN D-3 PO) Take by mouth daily.    [provider]  fish oil-omega-3 fatty acids 1000 MG capsule Take 1 g by mouth 2 (two) times daily.    [provider]  Magnesium Hydroxide (MAGNESIA PO) Take by mouth at bedtime.    [provider]  Multiple Vitamins-Minerals (MULTIVITAMIN PO) Take by mouth daily.    [provider]  Pyridoxine HCl (VITAMIN B-6 PO) Take by mouth at bedtime.    [provider]  Red Yeast Rice Extract (RED YEAST RICE PO) Take by mouth 2 (two) times daily.    [provider]   Allergies  Allergen Reactions  . Other     Airborne   Past Surgical History:  Procedure Laterality Date  . DILATION AND CURETTAGE OF UTERUS     Family History  Problem Relation Age of Onset  . Cancer Father   . Heart Problems Brother   . Hypertension Brother    Social History   Social History  . Marital status: Divorced    Spouse name: N/A  . Number of children: N/A  . Years of education: N/A   Social History Main Topics  . Smoking status: Never Smoker  . Smokeless tobacco: Never Used  . Alcohol use 0.6 oz/week    1 Glasses of  wine per week     Comment: 1-2 QHS  . Drug use: No  . Sexual activity: Not Asked   Other Topics Concern  . None   Social History Narrative   Exercises at [employer]. Meditation & yoga.    Depression screen PHQ 2/9 06/11/2017  Decreased Interest 0  Down, Depressed, Hopeless 0  PHQ - 2 Score 0    Review of Systems  Constitutional: Negative for chills, fatigue, fever and unexpected weight change.  HENT: Positive for dental problem. Negative for sinus pain, sinus pressure and sore throat.        Foul odor/smell  Respiratory: Negative for cough.   Gastrointestinal: Negative for constipation, diarrhea, nausea and vomiting.  Skin: Negative for rash and wound.  Neurological: Negative for dizziness, weakness and headaches.        Objective:   Physical Exam  Constitutional: She is oriented to person, place, and time. She appears well-developed and well-nourished. No distress.  HENT:  Head: Normocephalic and atraumatic.  Right Ear: Tympanic membrane is retracted.  Left Ear: Tympanic membrane normal.  Left nare: occluded with erythema mucosa Right nare: clear Oropharynx: white blanching exudate inner aspect of most posterior right tooth  Eyes: Pupils are equal, round, and reactive to light. EOM are normal.  Neck: Neck supple. No thyromegaly present.  Cardiovascular: Normal rate, regular rhythm, S1 normal, S2 normal and normal heart sounds.   No murmur heard. Pulmonary/Chest: Effort normal and breath sounds normal. No respiratory distress.  Musculoskeletal: Normal range of motion.  Lymphadenopathy:    She has no cervical adenopathy.  Neurological: She is alert and oriented to person, place, and time.  Skin: Skin is warm and dry.  Psychiatric: She has a normal mood and affect. Her behavior is normal.  Nursing note and vitals reviewed.   BP 124/70   Pulse 73   Temp 98.6 F (37 C) (Oral)   Resp 16   Ht 5' (1.524 m)   Wt 86 lb 6.4 oz (39.2 kg)   SpO2 98%   BMI 16.87 kg/m      Assessment & Plan:   1. Acute recurrent maxillary sinusitis   On amox mult times, last 1 mo prior. Try prevention w/ antibacterial mouthwash since she thinks it is coming from prior root canal.   Meds ordered this encounter  Medications  . levofloxacin (LEVAQUIN) 500 MG tablet    Sig: Take 1 tablet (500 mg total) by mouth daily.    Dispense:  7 tablet    Refill:  0  . chlorhexidine (PERIDEX) 0.12 % solution    Sig: Use as directed 15 mLs in the mouth or throat daily. As needed for infection    Dispense:  600 mL    Refill:  1    I personally performed the services described in this documentation, which was scribed in my presence. The recorded information has been reviewed and considered, and addended by me as needed.    Norberto Sorenson, M.D.  Primary Care at [facility] [address] [place], [place] [address] [phone] phone [phone] fax  06/12/17 9:36 PM

## 2017-06-20 ENCOUNTER — Telehealth: Payer: Self-pay | Admitting: Family Medicine

## 2017-06-20 NOTE — Telephone Encounter (Signed)
PT WOULD LIKE 7 MORE DAYS OF LEVAQUIN

## 2017-06-23 NOTE — Telephone Encounter (Signed)
Attempted to call pt We do not refill antibiotics without follow up visit VM not set up. Please advise pt if she returns call- will need to make an appt

## 2017-06-23 NOTE — Telephone Encounter (Addendum)
PATIENT CALLED TO SAY SHE LEFT A MESSAGE 4 DAYS AGO AND NO ONE HAS CALLED HER BACK. I EXPLAINED THAT OUR NURSE (ROSE) TRIED TO CONTACT HER TODAY AT 12:36pm. WHEN I TOLD HER THAT SHE WOULD HAVE TO COME IN FOR AN OFFICE VISIT BEFORE SHE COULD GET A REFILL ON THE LEVAQUIN SHE SAID "THIS PLACE IS OF NO USE TO HER" AND HUNG UP.  MBC

## 2017-07-05 DIAGNOSIS — R739 Hyperglycemia, unspecified: Secondary | ICD-10-CM | POA: Diagnosis not present

## 2017-07-05 DIAGNOSIS — E559 Vitamin D deficiency, unspecified: Secondary | ICD-10-CM | POA: Diagnosis not present

## 2017-07-05 DIAGNOSIS — I10 Essential (primary) hypertension: Secondary | ICD-10-CM | POA: Diagnosis not present

## 2017-07-05 DIAGNOSIS — M810 Age-related osteoporosis without current pathological fracture: Secondary | ICD-10-CM | POA: Diagnosis not present

## 2017-07-12 DIAGNOSIS — Z23 Encounter for immunization: Secondary | ICD-10-CM | POA: Diagnosis not present

## 2017-07-12 DIAGNOSIS — Z0000 Encounter for general adult medical examination without abnormal findings: Secondary | ICD-10-CM | POA: Diagnosis not present

## 2017-07-12 DIAGNOSIS — J329 Chronic sinusitis, unspecified: Secondary | ICD-10-CM | POA: Diagnosis not present

## 2017-07-14 ENCOUNTER — Encounter: Payer: Self-pay | Admitting: Internal Medicine

## 2017-07-14 ENCOUNTER — Ambulatory Visit: Payer: Medicare Other | Admitting: Internal Medicine

## 2017-07-14 VITALS — BP 130/77 | HR 76 | Ht <= 58 in | Wt 87.0 lb

## 2017-07-14 DIAGNOSIS — R002 Palpitations: Secondary | ICD-10-CM | POA: Diagnosis not present

## 2017-07-14 DIAGNOSIS — I10 Essential (primary) hypertension: Secondary | ICD-10-CM

## 2017-07-14 NOTE — Patient Instructions (Signed)
Your physician wants you to follow-up in: ONE YEAR with Dr. Hilty. You will receive a reminder letter in the mail two months in advance. If you don't receive a letter, please call our office to schedule the follow-up appointment.  

## 2017-07-14 NOTE — Progress Notes (Signed)
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 She had a complaint of chest heaviness at her last visit however that resolved and she's had no further episodes. She is continuing to be active.  Unfortunately, she recently suffered a torn left meniscus in her knee. She subsequently underwent arthroscopic surgery and has had marked improvement in her exercise ability. She underwent physical therapy and is now getting back to normal. Blood pressure remains well-controlled on low-dose amlodipine.  I had the pleasure of seeing Danielle [name] back in the office today.  She continues to do extremely well. She exercises regularly and denies any chest pain or worsening shortness of breath. Blood pressures been well controlled.  I reviewed recent laboratory work from her primary care provider which shows excellent cholesterol control and otherwise all labs are within normal limits.  At the pleasure see Danielle [name] here today in follow-up. She recent called in the office and noted that she's had some labile blood pressures. [facility]. When they filled her prescription she noted that she had a different generic for amlodipine. She said after taking that for 8-10 doses she felt some tightness in her chest, palpitations and noted her blood pressure gone up to 180. She then stopped the medicine and took some of her old prescription that she found. She called the office and requested a prescription for her old medication. She then switched to that with an improvement in blood pressure however she still has some symptoms and doesn't feel quite as well as she did several weeks ago. She reported the palpitations felt like a hard pounding in her chest. Her last stress test was in 2011 and negative for  ischemia.  06/11/2016  Danielle [name] returns today for follow-up. In April she had some palpitations and discomfort in her cst. She underwent a repea ischemia. She wore a monitor which showed some occasional PACs and PVCs but no A. Fib or other arrhythmias. Since [facility] and has noted no problems with her generic amlodipine. Blood pressure is well-controlled today.  07/14/2017  Danielle [name] returns today for annual follow-up.  She denies any worsening palpitations, dizziness, chest pain or shortness of breath.  She continues to exercise.  She is mostly struggling with some jaw pain after a recent root canal.  She says she is scheduled to have a CT scan to further investigate that.  From a cardiac standpoint however she seems to be fairly stable.  Blood pressure remains well controlled on amlodipine.  She also had recent labs at Dr. Elmyra RicksKim's office which were all within normal limits.  Her serum glucose was 99, serum creatinine 0.7, hemoglobin A1c 5.4 and stable in total control 215, HDL 85, LDL 112 and triglycerides of 90.  PMHx:  Past Medical History:  Diagnosis Date  . Atypical chest pain   . Dyslipidemia   . History of nuclear stress test 03/18/2010   dipyridamole; normal pattern of perfusion, low risk, normal study   . Hypertension     Past Surgical History:  Procedure Laterality Date  . DILATION AND CURETTAGE OF UTERUS      FAMHx:  Family History  Problem Relation Age of Onset  . Cancer Father   . Heart Problems Brother   . Hypertension  Brother     SOCHx:   reports that  has never smoked. she has never used smokeless tobacco. She reports that she drinks about 0.6 oz of alcohol per week. She reports that she does not use drugs.  ALLERGIES:  Allergies  Allergen Reactions  . Other     Airborne    ROS: Pertinent items noted in HPI and remainder of comprehensive ROS otherwise negative.  HOME MEDS: Current Outpatient Medications  Medication Sig Dispense  Refill  . amLODipine (NORVASC) 5 MG tablet TAKE 1 TABLET BY MOUTH DAILY 90 tablet 1  . b complex vitamins tablet Take 1 tablet by mouth daily.    Marland Kitchen. CALCIUM PO Take 1 tablet daily by mouth.     . cefdinir (OMNICEF) 300 MG capsule Take 300 mg every 12 (twelve) hours by mouth. (For 14 days)  0  . Cholecalciferol (VITAMIN D-3 PO) Take by mouth daily.    . fish oil-omega-3 fatty acids 1000 MG capsule Take 1 g by mouth 2 (two) times daily.    . Magnesium Hydroxide (MAGNESIA PO) Take by mouth at bedtime.    . Multiple Vitamins-Minerals (MULTIVITAMIN PO) Take by mouth daily.    . Pyridoxine HCl (VITAMIN B-6 PO) Take 1 tablet daily by mouth.     . Red Yeast Rice Extract (RED YEAST RICE PO) Take by mouth 2 (two) times daily.     No current facility-administered medications for this visit.     LABS/IMAGING: No results found for this or any previous visit (from the past 48 hour(s)). No results found.  VITALS: BP 130/77   Pulse 76   Ht 4\' 10"  (1.473 m)   Wt 87 lb (39.5 kg)   BMI 18.18 kg/m   EXAM: General appearance: alert and no distress Neck: no adenopathy, no carotid bruit, no JVD, supple, symmetrical, trachea midline and thyroid not enlarged, symmetric, no tenderness/mass/nodules Lungs: clear to auscultation bilaterally Heart: regular rate and rhythm, S1, S2 normal, no murmur, click, rub or gallop Abdomen: soft, non-tender; bowel sounds normal; no masses,  no organomegaly Extremities: extremities normal, atraumatic, no cyanosis or edema Pulses: 2+ and symmetric Skin: Skin color, texture, turgor normal. No rashes or lesions Neurologic: Grossly normal  EKG: Normal sinus rhythm at 76, right atrial enlargement-personally reviewed  ASSESSMENT: 1. Chest pain - low risk GXT 12/2015 2. Palpitations - occasional PAC's/PVC's 3. Hypertension-controlled  PLAN: 1.   Danielle [name] continues to do well and is asymptomatic.  Blood pressure is at goal.  She denies any recurrent palpitations.  She  has had no further chest pain.  We will continue her current medicines.  All of her labs are within normal limits for her current guideline recommendations.  Follow-up with me annually or sooner as necessary.  Danielle NoseKenneth C. Amarea Macdowell, MD, [facility] Attending Cardiologist [facility]  Lisette AbuKenneth C Michaelangelo Mittelman 07/14/2017, 9:30 AM

## 2017-07-18 ENCOUNTER — Other Ambulatory Visit: Payer: Self-pay | Admitting: Internal Medicine

## 2017-07-18 DIAGNOSIS — J329 Chronic sinusitis, unspecified: Secondary | ICD-10-CM

## 2017-07-25 ENCOUNTER — Ambulatory Visit
Admission: RE | Admit: 2017-07-25 | Discharge: 2017-07-25 | Disposition: A | Discharge status: Home / Self Care | Payer: Medicare Other | Source: Ambulatory Visit | Attending: Internal Medicine | Admitting: Internal Medicine

## 2017-07-25 DIAGNOSIS — J329 Chronic sinusitis, unspecified: Secondary | ICD-10-CM | POA: Diagnosis not present

## 2017-08-12 DIAGNOSIS — J329 Chronic sinusitis, unspecified: Secondary | ICD-10-CM | POA: Diagnosis not present

## 2017-08-12 DIAGNOSIS — Z0000 Encounter for general adult medical examination without abnormal findings: Secondary | ICD-10-CM | POA: Diagnosis not present

## 2017-08-25 DIAGNOSIS — H524 Presbyopia: Secondary | ICD-10-CM | POA: Diagnosis not present

## 2017-12-10 IMAGING — CT CT MAXILLOFACIAL W/O CM
2 of 3 series · 16 of 37 positions shown, 20 images · non-contrast
Comparison: None.

CLINICAL DATA: Sinusitis.

EXAM:
CT MAXILLOFACIAL WITHOUT CONTRAST
TECHNIQUE: Multidetector CT imaging of the maxillofacial structures was
performed. Multiplanar CT image reconstructions were also generated.

[Series 601: coronal facial · coronal · 0.33mm/px · 3 of 78 slices shown]
[im 26/78  bone]
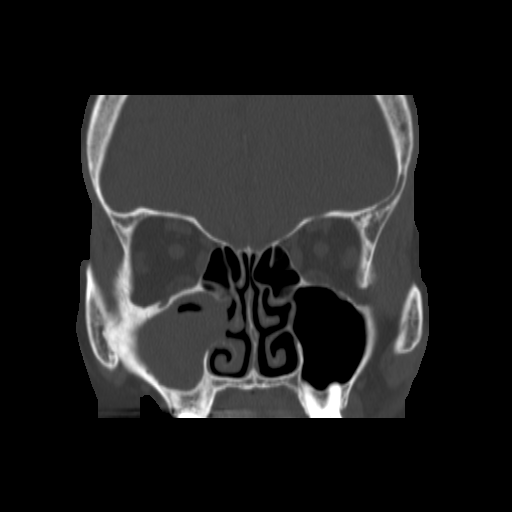
[im 39/78  bone]
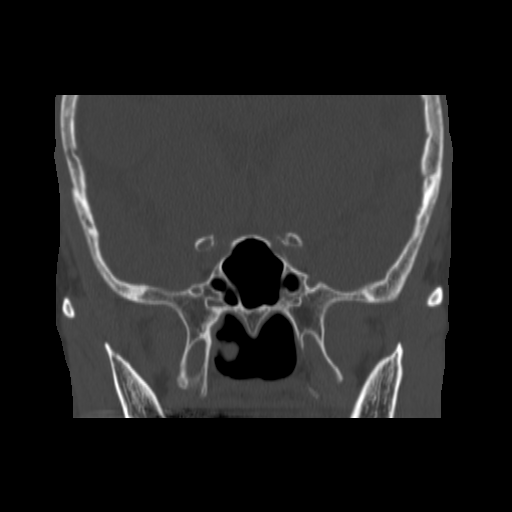
[im 52/78  bone]
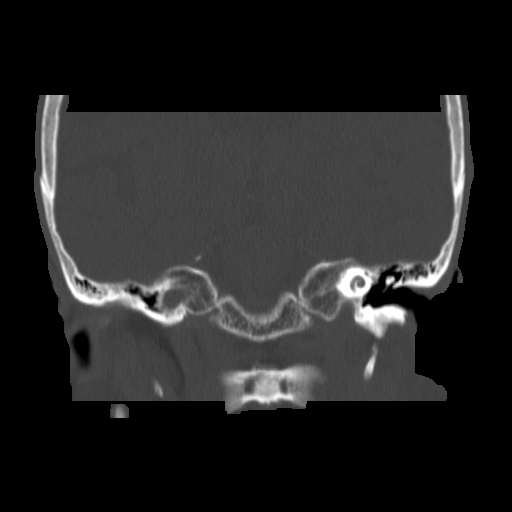

[Series 602: sagittal facial · sagittal · 0.33mm/px · 13 of 81 slices shown, 17 images]
[im 5/81  brain]
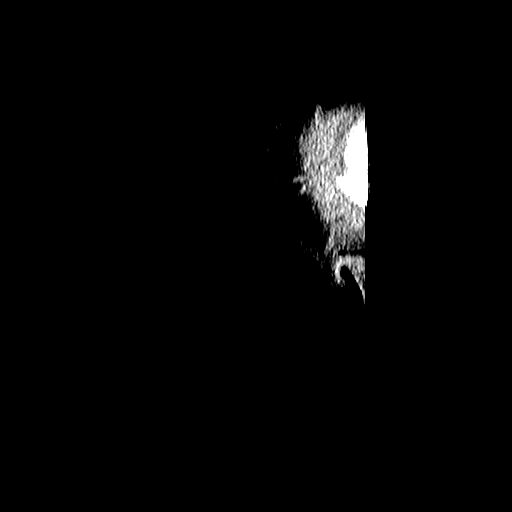
[im 5/81  bone]
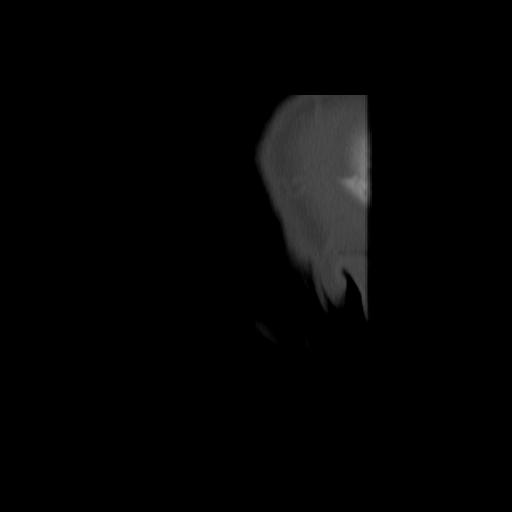
[im 9/81  bone]
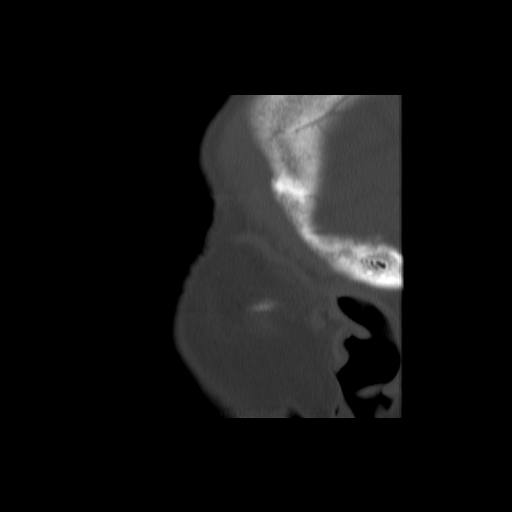
[im 18/81  bone]
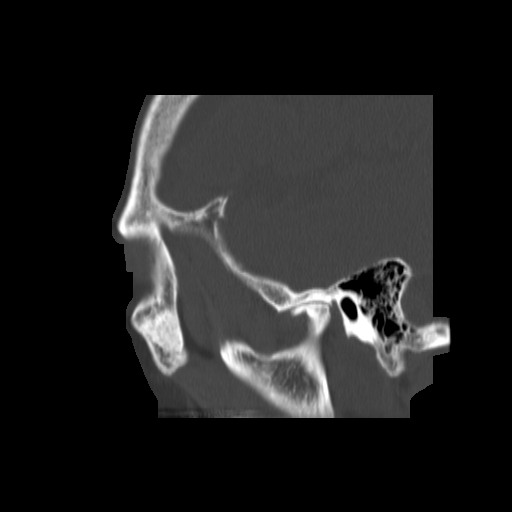
[im 23/81  bone]
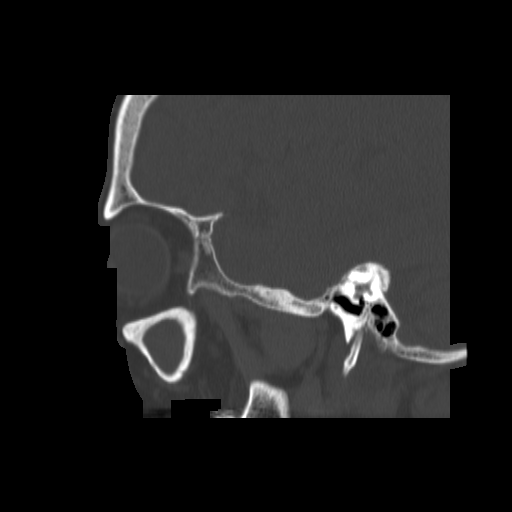
[im 27/81  brain]
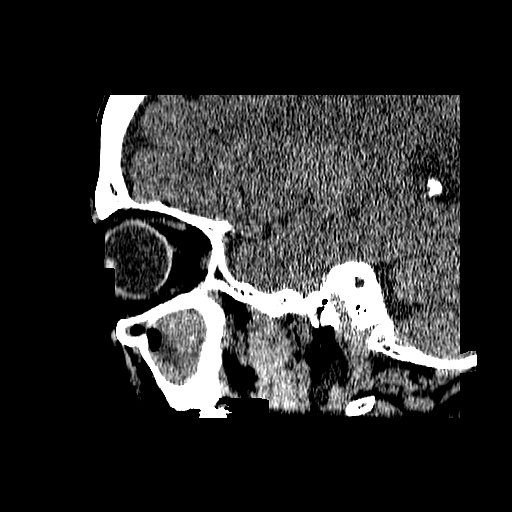
[im 27/81  bone]
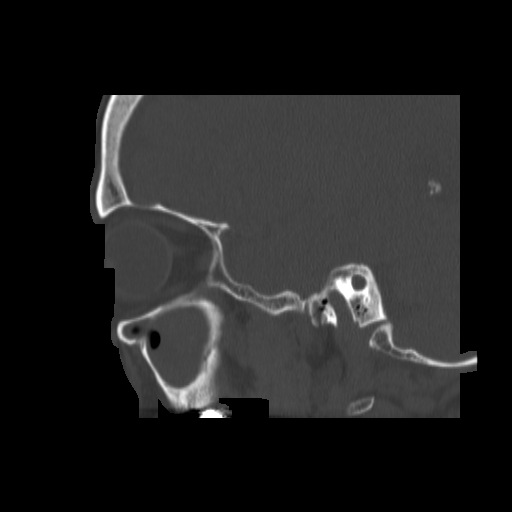
[im 36/81  bone]
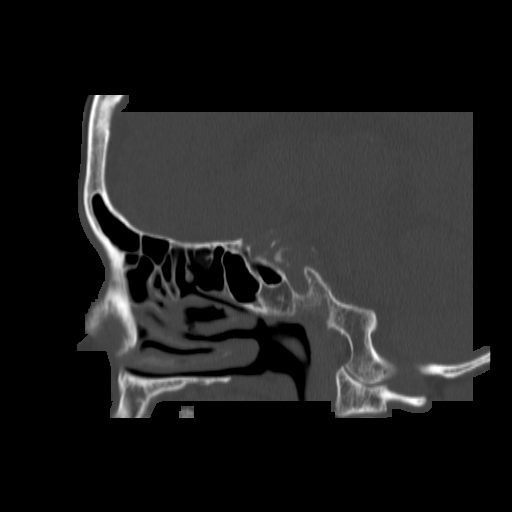
[im 41/81  bone]
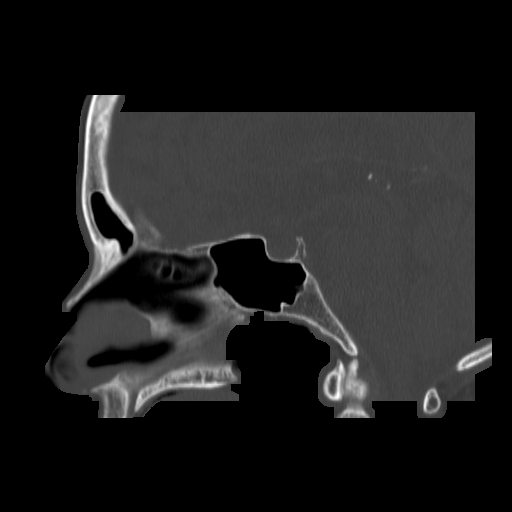
[im 45/81  bone]
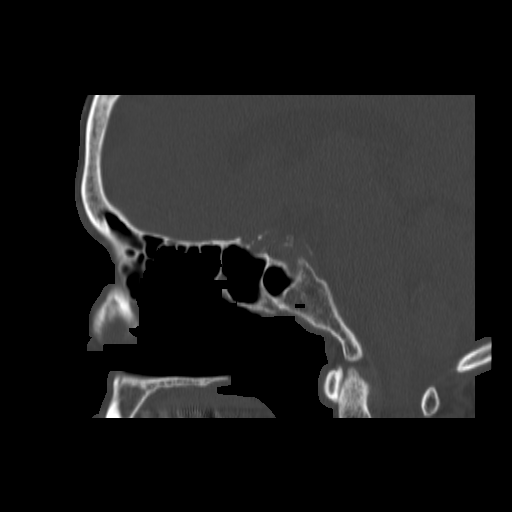
[im 54/81  brain]
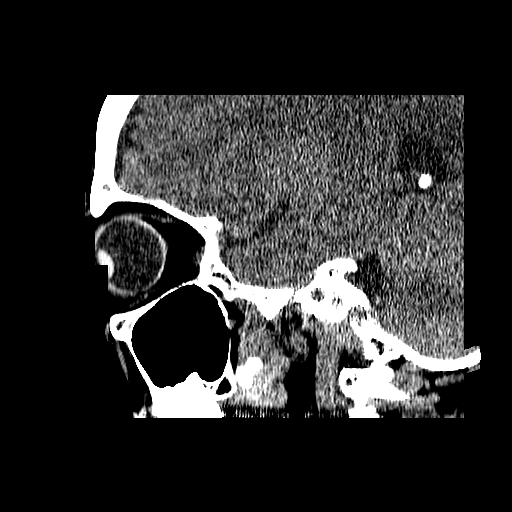
[im 54/81  bone]
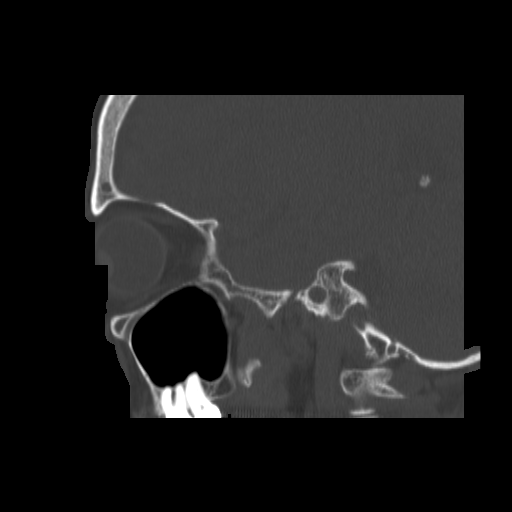
[im 58/81  bone]
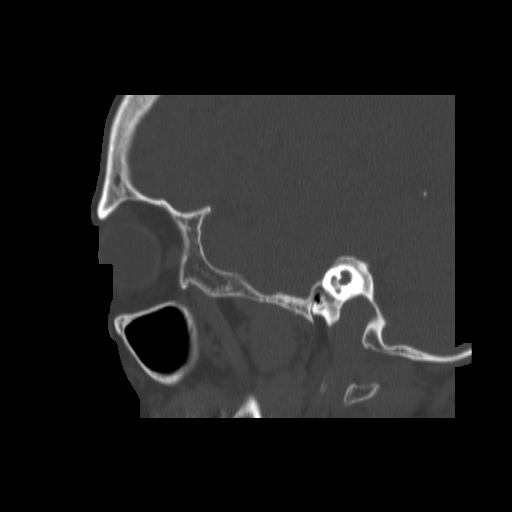
[im 63/81  bone]
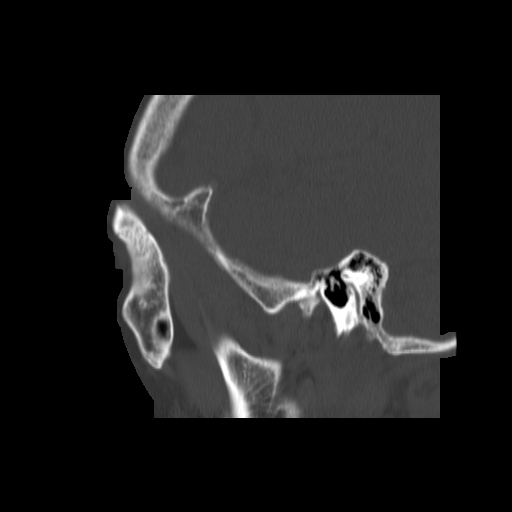
[im 72/81  bone]
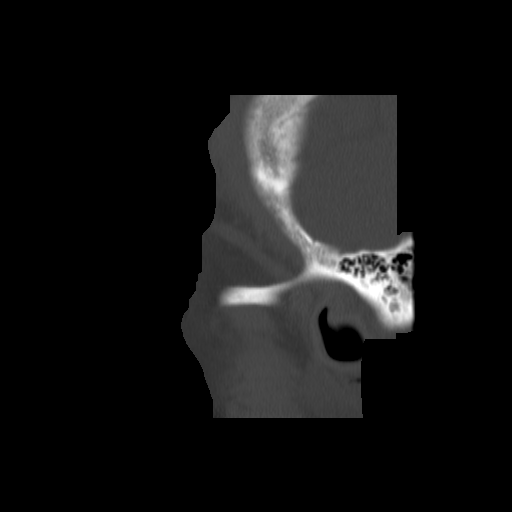
[im 76/81  brain]
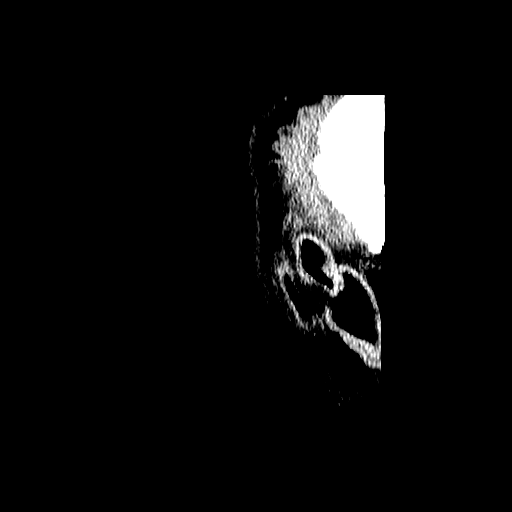
[im 76/81  bone]
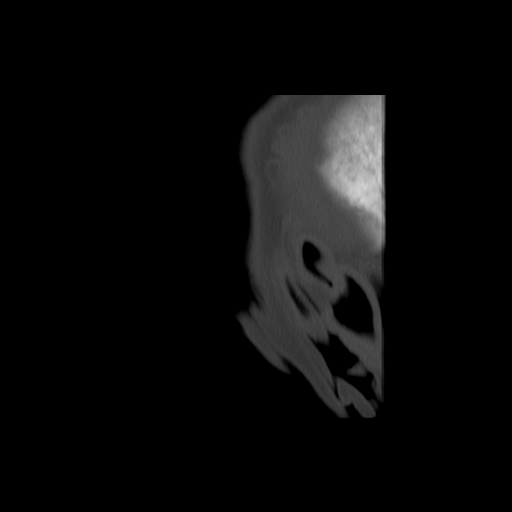

[16 of 37 positions shown; findings below may reference images not displayed]

FINDINGS: Osseous: No acute bony abnormality.  No fracture.

Orbits: Negative. No traumatic or inflammatory finding.

Sinuses: Mucosal thickening throughout the right maxillary sinus and
scattered right ethmoid air cells. No air-fluid levels. Mastoid air
cells are clear.

Soft tissues: Negative

Limited intracranial: Negative
IMPRESSION: Extensive mucosal thickening in the right maxillary sinus and
anterior right ethmoid air cells. No air-fluid levels. Findings
compatible with chronic sinusitis.

## 2018-02-11 ENCOUNTER — Other Ambulatory Visit: Payer: Self-pay | Admitting: Internal Medicine

## 2018-02-13 NOTE — Telephone Encounter (Signed)
Rx(s) sent to pharmacy electronically.  

## 2018-07-10 DIAGNOSIS — E559 Vitamin D deficiency, unspecified: Secondary | ICD-10-CM | POA: Diagnosis not present

## 2018-07-10 DIAGNOSIS — I10 Essential (primary) hypertension: Secondary | ICD-10-CM | POA: Diagnosis not present

## 2018-07-10 DIAGNOSIS — Z0000 Encounter for general adult medical examination without abnormal findings: Secondary | ICD-10-CM | POA: Diagnosis not present

## 2018-07-10 DIAGNOSIS — M810 Age-related osteoporosis without current pathological fracture: Secondary | ICD-10-CM | POA: Diagnosis not present

## 2018-07-14 ENCOUNTER — Ambulatory Visit: Payer: Medicare Other | Admitting: Internal Medicine

## 2018-07-14 ENCOUNTER — Encounter: Payer: Self-pay | Admitting: Internal Medicine

## 2018-07-14 VITALS — BP 110/62 | HR 76 | Ht <= 58 in | Wt 86.8 lb

## 2018-07-14 DIAGNOSIS — I10 Essential (primary) hypertension: Secondary | ICD-10-CM | POA: Diagnosis not present

## 2018-07-14 DIAGNOSIS — R002 Palpitations: Secondary | ICD-10-CM | POA: Diagnosis not present

## 2018-07-14 DIAGNOSIS — R9431 Abnormal electrocardiogram [ECG] [EKG]: Secondary | ICD-10-CM

## 2018-07-14 NOTE — Progress Notes (Signed)
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 She had a complaint of chest heaviness at her last visit however that resolved and she's had no further episodes. She is continuing to be active.  Unfortunately, she recently suffered a torn left meniscus in her knee. She subsequently underwent arthroscopic surgery and has had marked improvement in her exercise ability. She underwent physical therapy and is now getting back to normal. Blood pressure remains well-controlled on low-dose amlodipine.  I had the pleasure of seeing Danielle [name] back in the office today.  She continues to do extremely well. She exercises regularly and denies any chest pain or worsening shortness of breath. Blood pressures been well controlled.  I reviewed recent laboratory work from her primary care provider which shows excellent cholesterol control and otherwise all labs are within normal limits.  At the pleasure see Danielle [name] here today in follow-up. She recent called in the office and noted that she's had some labile blood pressures. [facility]. When they filled her prescription she noted that she had a different generic for amlodipine. She said after taking that for 8-10 doses she felt some tightness in her chest, palpitations and noted her blood pressure gone up to 180. She then stopped the medicine and took some of her old prescription that she found. She called the office and requested a prescription for her old medication. She then switched to that with an improvement in blood pressure however she still has some symptoms and doesn't feel quite as well as she did several weeks ago. She reported the palpitations felt like a hard pounding in her chest. Her last stress test was in 2011 and  negative for ischemia.  06/11/2016  Danielle [name] returns today for follow-up. In April she had some palpitations and discomfort in her cst. She underwent a repea ischemia. She wore a monitor which showed some occasional PACs and PVCs but no A. Fib or other arrhythmias. Since [facility] and has noted no problems with her generic amlodipine. Blood pressure is well-controlled today.  07/14/2017  Danielle [name] returns today for annual follow-up.  She denies any worsening palpitations, dizziness, chest pain or shortness of breath.  She continues to exercise.  She is mostly struggling with some jaw pain after a recent root canal.  She says she is scheduled to have a CT scan to further investigate that.  From a cardiac standpoint however she seems to be fairly stable.  Blood pressure remains well controlled on amlodipine.  She also had recent labs at Dr. Julianne Rice office which were all within normal limits.  Her serum glucose was 99, serum creatinine 0.7, hemoglobin A1c 5.4 and stable in total control 215, HDL 85, LDL 112 and triglycerides of 90.  07/14/2018  Danielle [name] seen today for annual follow-up.  I saw her exactly a year ago.  She was having some problems with jaw pain.  Ultimately she was found to have a cracked tooth and underwent to root canal.  Her symptoms improved after that.  She was also having some sinus congestion.  Because of her mouth pain she actually had trouble eating and has lost more weight.  Her BMI now is 18.  EKG today is mildly abnormal showing sinus rhythm with LVH  by voltage and some nonspecific lateral T wave changes.  This was not present last year, however may represent a strain pattern.  She is completely asymptomatic.  She continues with silver sneakers and exercises several times a week without symptoms.  PMHx:  Past Medical History:  Diagnosis Date  . Atypical chest pain   . Dyslipidemia   . History of nuclear stress test 03/18/2010   dipyridamole; normal  pattern of perfusion, low risk, normal study   . Hypertension     Past Surgical History:  Procedure Laterality Date  . DILATION AND CURETTAGE OF UTERUS      FAMHx:  Family History  Problem Relation Age of Onset  . Cancer Father   . Heart Problems Brother   . Hypertension Brother     SOCHx:   reports that she has never smoked. She has never used smokeless tobacco. She reports that she drinks about 1.0 standard drinks of alcohol per week. She reports that she does not use drugs.  ALLERGIES:  Allergies  Allergen Reactions  . Other     Airborne    ROS: Pertinent items noted in HPI and remainder of comprehensive ROS otherwise negative.  HOME MEDS: Current Outpatient Medications  Medication Sig Dispense Refill  . amLODipine (NORVASC) 5 MG tablet Take 1 tablet (5 mg total) by mouth daily. 90 tablet 1  . b complex vitamins tablet Take 1 tablet by mouth daily.    Marland Kitchen CALCIUM PO Take 1 tablet daily by mouth.     . Cholecalciferol (VITAMIN D-3 PO) Take by mouth daily.    . fish oil-omega-3 fatty acids 1000 MG capsule Take 1 g by mouth 2 (two) times daily.    . Magnesium Hydroxide (MAGNESIA PO) Take by mouth at bedtime.    . Multiple Vitamins-Minerals (MULTIVITAMIN PO) Take by mouth daily.    . Multiple Vitamins-Minerals (OCUVITE EYE HEALTH FORMULA) CAPS Take 1 capsule by mouth daily.    . Pyridoxine HCl (VITAMIN B-6 PO) Take 1 tablet daily by mouth.      No current facility-administered medications for this visit.     LABS/IMAGING: No results found for this or any previous visit (from the past 48 hour(s)). No results found.  VITALS: BP 110/62   Pulse 76   Ht 4\' 10"  (1.473 m)   Wt 86 lb 12.8 oz (39.4 kg)   BMI 18.14 kg/m   EXAM: General appearance: alert and no distress Neck: no adenopathy, no carotid bruit, no JVD, supple, symmetrical, trachea midline and thyroid not enlarged, symmetric, no tenderness/[name]/nodules Lungs: clear to auscultation bilaterally Heart:  regular rate and rhythm, S1, S2 normal, no murmur, click, rub or gallop Abdomen: soft, non-tender; bowel sounds normal; no masses,  no organomegaly Extremities: extremities normal, atraumatic, no cyanosis or edema Pulses: 2+ and symmetric Skin: Skin color, texture, turgor normal. No rashes or lesions Neurologic: Grossly normal  EKG: Normal sinus rhythm at 76, possible left atrial enlargement, LVH by voltage and nonspecific lateral T wave changes-personally reviewed  ASSESSMENT: 1. Chest pain - low risk GXT 12/2015 2. Palpitations - occasional PAC's/PVC's 3. Hypertension-controlled 4. LVH with lateral T wave changes  PLAN: 1.   Danielle [name] was found to have dental problems leading to her jaw pain.  She had lost weight because of difficulty eating however is gaining that back.  She denies any chest pain or shortness of breath.  She is had no palpitations.  Her blood pressure is well controlled.  EKG shows some lateral T  wave changes however voltage for LVH suggestive of possible cardiomyopathy/LVH.  Given her regular activity and the fact she is asymptomatic, will not pursue additional testing at this time.  Follow-up with me annually or sooner as necessary.  Danielle Nose, MD, [facility], FACP  [place]  [facility]  Medical Director of the [facility]  [facility] Diplomate of the American Board of Clinical Lipidology Attending Cardiologist  Direct Dial: [phone]  Fax: [phone]  Website:  [name] Danielle [name] 07/14/2018, 8:32 AM

## 2018-07-14 NOTE — Patient Instructions (Signed)
Medication Instructions:  Your physician recommends that you continue on your current medications as directed. Please refer to the Current Medication list given to you today.  If you need a refill on your cardiac medications before your next appointment, please call your pharmacy.   Lab work: None  If you have labs (blood work) drawn today and your tests are completely normal, you will receive your results only by: . MyChart Message (if you have MyChart) OR . A paper copy in the mail If you have any lab test that is abnormal or we need to change your treatment, we will call you to review the results.  Testing/Procedures: None   Follow-Up: At [facility], you and your health needs are our priority.  As part of our continuing mission to provide you with exceptional heart care, we have created designated Provider Care Teams.  These Care Teams include your primary Cardiologist (physician) and Advanced Practice Providers (APPs -  Physician Assistants and Nurse Practitioners) who all work together to provide you with the care you need, when you need it. You will need a follow up appointment in 12 months.  Please call our office 2 months in advance to schedule this appointment.  You may see Dr Kenneth Hilty or one of the following Advanced Practice Providers on your designated Care Team: Hao Meng, PA-C . Angela Duke, PA-C  Any Other Special Instructions Will Be Listed Below (If Applicable).    

## 2018-07-17 DIAGNOSIS — Z0000 Encounter for general adult medical examination without abnormal findings: Secondary | ICD-10-CM | POA: Diagnosis not present

## 2018-07-17 DIAGNOSIS — M810 Age-related osteoporosis without current pathological fracture: Secondary | ICD-10-CM | POA: Diagnosis not present

## 2018-07-17 DIAGNOSIS — E7800 Pure hypercholesterolemia, unspecified: Secondary | ICD-10-CM | POA: Diagnosis not present

## 2018-07-17 DIAGNOSIS — I10 Essential (primary) hypertension: Secondary | ICD-10-CM | POA: Diagnosis not present

## 2018-07-19 DIAGNOSIS — J320 Chronic maxillary sinusitis: Secondary | ICD-10-CM | POA: Diagnosis not present

## 2018-07-19 DIAGNOSIS — J322 Chronic ethmoidal sinusitis: Secondary | ICD-10-CM | POA: Diagnosis not present

## 2018-07-31 DIAGNOSIS — Z23 Encounter for immunization: Secondary | ICD-10-CM | POA: Diagnosis not present

## 2018-08-11 ENCOUNTER — Other Ambulatory Visit: Payer: Self-pay | Admitting: Cardiovascular Disease

## 2018-08-16 DIAGNOSIS — J320 Chronic maxillary sinusitis: Secondary | ICD-10-CM | POA: Diagnosis not present

## 2018-08-16 DIAGNOSIS — J342 Deviated nasal septum: Secondary | ICD-10-CM | POA: Diagnosis not present

## 2018-08-16 DIAGNOSIS — J322 Chronic ethmoidal sinusitis: Secondary | ICD-10-CM | POA: Diagnosis not present

## 2018-08-22 ENCOUNTER — Other Ambulatory Visit (INDEPENDENT_AMBULATORY_CARE_PROVIDER_SITE_OTHER): Payer: Self-pay | Admitting: Otolaryngology

## 2018-08-22 DIAGNOSIS — J320 Chronic maxillary sinusitis: Secondary | ICD-10-CM

## 2018-08-25 ENCOUNTER — Ambulatory Visit
Admission: RE | Admit: 2018-08-25 | Discharge: 2018-08-25 | Disposition: A | Discharge status: Home / Self Care | Payer: Medicare Other | Source: Ambulatory Visit | Attending: Otolaryngology | Admitting: Otolaryngology

## 2018-08-25 DIAGNOSIS — J320 Chronic maxillary sinusitis: Secondary | ICD-10-CM | POA: Diagnosis not present

## 2018-08-28 DIAGNOSIS — H5213 Myopia, bilateral: Secondary | ICD-10-CM | POA: Diagnosis not present

## 2018-09-15 DIAGNOSIS — J322 Chronic ethmoidal sinusitis: Secondary | ICD-10-CM | POA: Diagnosis not present

## 2018-09-15 DIAGNOSIS — J343 Hypertrophy of nasal turbinates: Secondary | ICD-10-CM | POA: Diagnosis not present

## 2018-09-15 DIAGNOSIS — J320 Chronic maxillary sinusitis: Secondary | ICD-10-CM | POA: Diagnosis not present

## 2018-09-15 DIAGNOSIS — J342 Deviated nasal septum: Secondary | ICD-10-CM | POA: Diagnosis not present

## 2018-11-13 ENCOUNTER — Other Ambulatory Visit: Payer: Self-pay | Admitting: Otolaryngology

## 2018-12-20 ENCOUNTER — Encounter (HOSPITAL_COMMUNITY): Payer: Self-pay

## 2018-12-20 ENCOUNTER — Emergency Department (HOSPITAL_COMMUNITY): Payer: Medicare Other

## 2018-12-20 ENCOUNTER — Emergency Department (HOSPITAL_COMMUNITY)
Admission: EM | Admit: 2018-12-20 | Discharge: 2018-12-20 | Disposition: A | Discharge status: Home / Self Care | Payer: Medicare Other | Attending: Emergency Medicine | Admitting: Emergency Medicine

## 2018-12-20 ENCOUNTER — Other Ambulatory Visit: Payer: Self-pay

## 2018-12-20 DIAGNOSIS — S0101XA Laceration without foreign body of scalp, initial encounter: Secondary | ICD-10-CM

## 2018-12-20 DIAGNOSIS — Y939 Activity, unspecified: Secondary | ICD-10-CM | POA: Diagnosis not present

## 2018-12-20 DIAGNOSIS — Z79899 Other long term (current) drug therapy: Secondary | ICD-10-CM | POA: Insufficient documentation

## 2018-12-20 DIAGNOSIS — W1839XA Other fall on same level, initial encounter: Secondary | ICD-10-CM | POA: Insufficient documentation

## 2018-12-20 DIAGNOSIS — R51 Headache: Secondary | ICD-10-CM | POA: Diagnosis not present

## 2018-12-20 DIAGNOSIS — I10 Essential (primary) hypertension: Secondary | ICD-10-CM | POA: Insufficient documentation

## 2018-12-20 DIAGNOSIS — Y999 Unspecified external cause status: Secondary | ICD-10-CM | POA: Diagnosis not present

## 2018-12-20 DIAGNOSIS — W19XXXA Unspecified fall, initial encounter: Secondary | ICD-10-CM

## 2018-12-20 DIAGNOSIS — Y92009 Unspecified place in unspecified non-institutional (private) residence as the place of occurrence of the external cause: Secondary | ICD-10-CM | POA: Diagnosis not present

## 2018-12-20 DIAGNOSIS — Z23 Encounter for immunization: Secondary | ICD-10-CM | POA: Diagnosis not present

## 2018-12-20 MED ORDER — LIDOCAINE-EPINEPHRINE (PF) 2 %-1:200000 IJ SOLN
10.0000 mL | Freq: Once | INTRAMUSCULAR | Status: AC
  Administered 2018-12-20: 10 mL
  Filled 2018-12-20: qty 10

## 2018-12-20 MED ORDER — TETANUS-DIPHTH-ACELL PERTUSSIS 5-2.5-18.5 LF-MCG/0.5 IM SUSP
0.5000 mL | Freq: Once | INTRAMUSCULAR | Status: AC
  Administered 2018-12-20: 0.5 mL via INTRAMUSCULAR
  Filled 2018-12-20: qty 0.5

## 2018-12-20 NOTE — ED Triage Notes (Signed)
Patient was facetiming a friend and having a glass of white wine. Patient got up to walk to the kitchen, stumbled and fell, striking R side of head. Patient denies LOC and denies blood thinners. A&Ox4. Ambulatory with steady gait. Patient controlling bleeding with towel. Denies any other injury or pain.

## 2018-12-20 NOTE — ED Provider Notes (Signed)
[facility]-EMERGENCY DEPT Provider Note   CSN: [id number] Arrival date & time: 12/20/18  1959    History   Chief Complaint Chief Complaint  Patient presents with  . Fall    HPI Danielle [name] is a 83 y.o. female.     83 year old female with prior medical history as detailed below presents following mechanical fall.  She reports that she was at home.  She had been facing him with a friend in [place].  She got up and lost her balance and fell.  She struck the right posterior part of her head against the floor.  She did not pass out.  She did have bleeding from the injury.  She denies neck pain.  She denies other injury.  She is unsure of her last tetanus.  The history is provided by the patient and medical records.  Fall  This is a new problem. The current episode started less than 1 hour ago. The problem occurs constantly. The problem has not changed since onset.Pertinent negatives include no chest pain, no abdominal pain, no headaches and no shortness of breath. Nothing aggravates the symptoms. Nothing relieves the symptoms.    Past Medical History:  Diagnosis Date  . Atypical chest pain   . Dyslipidemia   . History of nuclear stress test 03/18/2010   dipyridamole; normal pattern of perfusion, low risk, normal study   . Hypertension     Patient Active Problem List   Diagnosis Date Noted  . Chest pain 12/02/2015  . Heart palpitations 12/02/2015  . Labile hypertension 12/02/2015  . Essential hypertension 05/24/2013    Past Surgical History:  Procedure Laterality Date  . DILATION AND CURETTAGE OF UTERUS       OB History   No obstetric history on file.      Home Medications    Prior to Admission medications   Medication Sig Start Date End Date Taking? Authorizing Provider  amLODipine (NORVASC) 5 MG tablet Take 1 tablet (5 mg total) by mouth daily. 08/11/18  Yes Croitoru, Mihai, MD  amoxicillin-clavulanate (AUGMENTIN) 875-125 MG tablet Take 1  tablet by mouth 2 (two) times daily.   Yes [provider]  b complex vitamins tablet Take 1 tablet by mouth daily.   Yes [provider]  chlorpheniramine (CHLOR-TRIMETON) 4 MG tablet Take 4 mg by mouth 2 (two) times daily as needed for allergies.   Yes [provider]  Cholecalciferol (VITAMIN D-3 PO) Take 1 tablet by mouth daily.    Yes [provider]  Multiple Vitamins-Minerals (MULTIVITAMIN PO) Take by mouth daily.   Yes [provider]    Family History Family History  Problem Relation Age of Onset  . Cancer Father   . Heart Problems Brother   . Hypertension Brother     Social History Social History   Tobacco Use  . Smoking status: Never Smoker  . Smokeless tobacco: Never Used  Substance Use Topics  . Alcohol use: Yes    Alcohol/week: 1.0 standard drinks    Types: 1 Glasses of wine per week    Comment: 1-2 QHS  . Drug use: No     Allergies   Patient has no known allergies.   Review of Systems Review of Systems  Respiratory: Negative for shortness of breath.   Cardiovascular: Negative for chest pain.  Gastrointestinal: Negative for abdominal pain.  Neurological: Negative for headaches.  All other systems reviewed and are negative.    Physical Exam Updated Vital Signs BP Marland Kitchen)  143/91 (BP Location: Right Arm)   Pulse (!) 18   Temp (!) 97.5 F (36.4 C) (Oral)   Resp 18   Ht 5' (1.524 m)   Wt 39.9 kg   SpO2 100%   BMI 17.19 kg/m   Physical Exam Vitals signs and nursing note reviewed.  Constitutional:      General: She is not in acute distress.    Appearance: She is well-developed.  HENT:     Head: Normocephalic.     Comments: Right posterior scalp contusion surrounding 1 cm laceration.  Eyes:     Conjunctiva/sclera: Conjunctivae normal.     Pupils: Pupils are equal, round, and reactive to light.  Neck:     Musculoskeletal: Normal range of motion and neck supple.  Cardiovascular:     Rate and Rhythm:  Normal rate and regular rhythm.     Heart sounds: Normal heart sounds.  Pulmonary:     Effort: Pulmonary effort is normal. No respiratory distress.     Breath sounds: Normal breath sounds.  Abdominal:     General: There is no distension.     Palpations: Abdomen is soft.     Tenderness: There is no abdominal tenderness.  Musculoskeletal: Normal range of motion.        General: No deformity.  Skin:    General: Skin is warm and dry.  Neurological:     Mental Status: She is alert and oriented to person, place, and time. Mental status is at baseline.     Cranial Nerves: No cranial nerve deficit.     Sensory: No sensory deficit.     Motor: No weakness.     Coordination: Coordination normal.      ED Treatments / Results  Labs (all labs ordered are listed, but only abnormal results are displayed) Labs Reviewed - No data to display  EKG None  Radiology Ct Head Wo Contrast  Result Date: 12/20/2018 CLINICAL DATA:  Recent fall with headaches and neck pain, initial encounter EXAM: CT HEAD WITHOUT CONTRAST TECHNIQUE: Contiguous axial images were obtained from the base of the skull through the vertex without intravenous contrast. COMPARISON:  08/25/2018 FINDINGS: Brain: Mild atrophic and chronic white matter ischemic changes are seen. No findings to suggest acute hemorrhage, acute infarction or space-occupying mass lesion are noted. Vascular: No hyperdense vessel or unexpected calcification. Skull: Normal. Negative for fracture or focal lesion. Sinuses/Orbits: Orbits are within normal limits. Postsurgical changes in the right maxillary antrum are seen with mucosal thickening stable from the prior exam. Other: None IMPRESSION: Chronic atrophic and ischemic changes without acute abnormality. Electronically Signed   By: Alcide Clever M.D.   On: 12/20/2018 21:32    Procedures .Marland KitchenLaceration Repair Date/Time: 12/20/2018 10:13 PM Performed by: Wynetta Fines, MD Authorized by: Wynetta Fines, MD    Consent:    Consent obtained:  Verbal   Consent given by:  Patient   Risks discussed:  Infection, pain, poor cosmetic result, poor wound healing, need for additional repair, nerve damage and vascular damage   Alternatives discussed:  No treatment Anesthesia (see MAR for exact dosages):    Anesthesia method:  Topical application and local infiltration   Local anesthetic:  Lidocaine 2% WITH epi Laceration details:    Location:  Scalp   Scalp location:  R parietal   Length (cm):  1 Repair type:    Repair type:  Simple Pre-procedure details:    Preparation:  Patient was prepped and draped in usual sterile fashion Exploration:  Hemostasis achieved with:  Direct pressure and epinephrine   Wound exploration: entire depth of wound probed and visualized     Wound extent: no fascia violation noted and no foreign bodies/material noted     Contaminated: no   Treatment:    Area cleansed with:  Betadine   Amount of cleaning:  Extensive   Irrigation solution:  Sterile saline   Irrigation method:  Syringe   Visualized foreign bodies/material removed: no   Skin repair:    Repair method:  Sutures   Suture size:  4-0   Suture material:  Prolene   Suture technique:  Simple interrupted   Number of sutures:  2 Approximation:    Approximation:  Close Post-procedure details:    Dressing:  Bulky dressing   Patient tolerance of procedure:  Tolerated well, no immediate complications   (including critical care time)  Medications Ordered in ED Medications  Tdap (BOOSTRIX) injection 0.5 mL (0.5 mLs Intramuscular Given 12/20/18 2023)  lidocaine-EPINEPHrine (XYLOCAINE W/EPI) 2 %-1:200000 (PF) injection 10 mL (10 mLs Infiltration Given 12/20/18 2023)     Initial Impression / Assessment and Plan / ED Course  I have reviewed the triage vital signs and the nursing notes.  Pertinent labs & imaging results that were available during my care of the patient were reviewed by me and considered in my  medical decision making (see chart for details).        MDM  Screen complete  Patient is presenting for evaluation following reported fall.  Patient's fall was mechanical in nature.  She did have a head injury with scalp laceration.  No other injury found on exam.   CT imaging did not reveal intracranial injury.  Scalp laceration was repaired with 2 simple interrupted sutures.  Patient tolerated this procedure well.  Patient is appropriate for discharge.  She does understand the need for close follow-up.  Strict return precautions given and understood.  Final Clinical Impressions(s) / ED Diagnoses   Final diagnoses:  Fall, initial encounter  Laceration of scalp, initial encounter    ED Discharge Orders    None       Wynetta FinesMessick, Shada Nienaber C, MD 12/20/18 2215

## 2018-12-20 NOTE — Discharge Instructions (Addendum)
Return for any problem.  Follow-up with your regular care provider as instructed.  Your scalp sutures (2) will need to be removed in 7 days.  This can be done at this facility or with your regular care provider.

## 2018-12-20 NOTE — ED Notes (Signed)
Using clean technique, used wound cleaner to clean around the sutured laceration. Applied 3 4x4 guazes, kerlix, and secured with tape. Patient tolerated it well. Afterwards, patient ambulated to restroom and back.

## 2018-12-28 DIAGNOSIS — Z4802 Encounter for removal of sutures: Secondary | ICD-10-CM | POA: Diagnosis not present

## 2019-02-13 ENCOUNTER — Other Ambulatory Visit: Payer: Self-pay

## 2019-02-13 ENCOUNTER — Encounter (HOSPITAL_BASED_OUTPATIENT_CLINIC_OR_DEPARTMENT_OTHER): Payer: Self-pay

## 2019-02-16 ENCOUNTER — Other Ambulatory Visit (HOSPITAL_COMMUNITY)
Admission: RE | Admit: 2019-02-16 | Discharge: 2019-02-16 | Disposition: A | Discharge status: Home / Self Care | Payer: Medicare Other | Source: Ambulatory Visit | Attending: Otolaryngology | Admitting: Otolaryngology

## 2019-02-16 DIAGNOSIS — Z1159 Encounter for screening for other viral diseases: Secondary | ICD-10-CM | POA: Diagnosis not present

## 2019-02-16 DIAGNOSIS — Z01812 Encounter for preprocedural laboratory examination: Secondary | ICD-10-CM | POA: Diagnosis not present

## 2019-02-17 LAB — NOVEL CORONAVIRUS, NAA (HOSP ORDER, SEND-OUT TO REF LAB; TAT 18-24 HRS): SARS-CoV-2, NAA: NOT DETECTED

## 2019-02-19 NOTE — Anesthesia Preprocedure Evaluation (Addendum)
Anesthesia Evaluation  Patient identified by MRN, date of birth, ID band Patient awake    Reviewed: Allergy & Precautions, NPO status , Patient's Chart, lab work & pertinent test results  History of Anesthesia Complications Negative for: history of anesthetic complications  Airway Mallampati: II  TM Distance: >3 FB Neck ROM: Full    Dental no notable dental hx.    Pulmonary neg pulmonary ROS,    Pulmonary exam normal        Cardiovascular hypertension, Pt. on medications Normal cardiovascular exam     Neuro/Psych negative neurological ROS     GI/Hepatic negative GI ROS, Neg liver ROS,   Endo/Other  negative endocrine ROS  Renal/GU negative Renal ROS     Musculoskeletal negative musculoskeletal ROS (+)   Abdominal   Peds  Hematology negative hematology ROS (+)   Anesthesia Other Findings Day of surgery medications reviewed with the patient.  Reproductive/Obstetrics                            Anesthesia Physical Anesthesia Plan  ASA: II  Anesthesia Plan: General   Post-op Pain Management:    Induction: Intravenous  PONV Risk Score and Plan: Treatment may vary due to age or medical condition and Ondansetron  Airway Management Planned: Oral ETT  Additional Equipment:   Intra-op Plan:   Post-operative Plan: Extubation in OR  Informed Consent: I have reviewed the patients History and Physical, chart, labs and discussed the procedure including the risks, benefits and alternatives for the proposed anesthesia with the patient or authorized representative who has indicated his/her understanding and acceptance.     Dental advisory given  Plan Discussed with: CRNA  Anesthesia Plan Comments:        Anesthesia Quick Evaluation

## 2019-02-20 ENCOUNTER — Encounter (HOSPITAL_BASED_OUTPATIENT_CLINIC_OR_DEPARTMENT_OTHER)
Admission: RE | Disposition: A | Discharge status: Home / Self Care | Payer: Self-pay | Source: Home / Self Care | Attending: Otolaryngology

## 2019-02-20 ENCOUNTER — Other Ambulatory Visit: Payer: Self-pay

## 2019-02-20 ENCOUNTER — Ambulatory Visit (HOSPITAL_BASED_OUTPATIENT_CLINIC_OR_DEPARTMENT_OTHER): Payer: Medicare Other | Admitting: Anesthesiology

## 2019-02-20 ENCOUNTER — Ambulatory Visit (HOSPITAL_BASED_OUTPATIENT_CLINIC_OR_DEPARTMENT_OTHER)
Admission: RE | Admit: 2019-02-20 | Discharge: 2019-02-20 | Disposition: A | Discharge status: Home / Self Care | Payer: Medicare Other | Attending: Otolaryngology | Admitting: Otolaryngology

## 2019-02-20 ENCOUNTER — Encounter (HOSPITAL_BASED_OUTPATIENT_CLINIC_OR_DEPARTMENT_OTHER): Payer: Self-pay | Admitting: *Deleted

## 2019-02-20 DIAGNOSIS — R600 Localized edema: Secondary | ICD-10-CM | POA: Diagnosis not present

## 2019-02-20 DIAGNOSIS — J328 Other chronic sinusitis: Secondary | ICD-10-CM | POA: Diagnosis not present

## 2019-02-20 DIAGNOSIS — J322 Chronic ethmoidal sinusitis: Secondary | ICD-10-CM | POA: Diagnosis not present

## 2019-02-20 DIAGNOSIS — I10 Essential (primary) hypertension: Secondary | ICD-10-CM | POA: Diagnosis not present

## 2019-02-20 DIAGNOSIS — R0981 Nasal congestion: Secondary | ICD-10-CM | POA: Diagnosis not present

## 2019-02-20 DIAGNOSIS — J3489 Other specified disorders of nose and nasal sinuses: Secondary | ICD-10-CM | POA: Diagnosis not present

## 2019-02-20 DIAGNOSIS — J343 Hypertrophy of nasal turbinates: Secondary | ICD-10-CM | POA: Diagnosis not present

## 2019-02-20 DIAGNOSIS — J338 Other polyp of sinus: Secondary | ICD-10-CM | POA: Diagnosis not present

## 2019-02-20 DIAGNOSIS — J329 Chronic sinusitis, unspecified: Secondary | ICD-10-CM | POA: Diagnosis not present

## 2019-02-20 DIAGNOSIS — J342 Deviated nasal septum: Secondary | ICD-10-CM | POA: Diagnosis not present

## 2019-02-20 DIAGNOSIS — J320 Chronic maxillary sinusitis: Secondary | ICD-10-CM | POA: Insufficient documentation

## 2019-02-20 HISTORY — PX: ETHMOIDECTOMY: SHX5197

## 2019-02-20 HISTORY — PX: MAXILLARY ANTROSTOMY: SHX2003

## 2019-02-20 HISTORY — PX: NASAL SEPTOPLASTY W/ TURBINOPLASTY: SHX2070

## 2019-02-20 HISTORY — PX: SINUS ENDO WITH FUSION: SHX5329

## 2019-02-20 SURGERY — SEPTOPLASTY, NOSE, WITH NASAL TURBINATE REDUCTION
Anesthesia: General | Site: Nose | Laterality: Right

## 2019-02-20 MED ORDER — FENTANYL CITRATE (PF) 100 MCG/2ML IJ SOLN
INTRAMUSCULAR | Status: AC
  Filled 2019-02-20: qty 2

## 2019-02-20 MED ORDER — LIDOCAINE-EPINEPHRINE 1 %-1:100000 IJ SOLN
INTRAMUSCULAR | Status: DC | PRN
  Administered 2019-02-20: 3 mL

## 2019-02-20 MED ORDER — OXYCODONE HCL 5 MG/5ML PO SOLN: 5.0000 mg | Freq: Once | ORAL | Status: DC | PRN

## 2019-02-20 MED ORDER — ROCURONIUM BROMIDE 10 MG/ML (PF) SYRINGE
PREFILLED_SYRINGE | INTRAVENOUS | Status: AC
  Filled 2019-02-20: qty 10

## 2019-02-20 MED ORDER — PHENYLEPHRINE HCL (PRESSORS) 10 MG/ML IV SOLN
INTRAVENOUS | Status: DC | PRN
  Administered 2019-02-20 (×4): 40 ug via INTRAVENOUS

## 2019-02-20 MED ORDER — BACITRACIN ZINC 500 UNIT/GM EX OINT
TOPICAL_OINTMENT | CUTANEOUS | Status: AC
  Filled 2019-02-20: qty 0.9

## 2019-02-20 MED ORDER — OXYCODONE-ACETAMINOPHEN 5-325 MG PO TABS: 1.0000 | ORAL_TABLET | Freq: Four times a day (QID) | ORAL | 0 refills | Status: AC | PRN

## 2019-02-20 MED ORDER — ROCURONIUM BROMIDE 100 MG/10ML IV SOLN
INTRAVENOUS | Status: DC | PRN
  Administered 2019-02-20: 25 mg via INTRAVENOUS

## 2019-02-20 MED ORDER — LACTATED RINGERS IV SOLN
INTRAVENOUS | Status: DC
  Administered 2019-02-20 (×2): via INTRAVENOUS

## 2019-02-20 MED ORDER — AMOXICILLIN 875 MG PO TABS: 875.0000 mg | ORAL_TABLET | Freq: Two times a day (BID) | ORAL | 0 refills | Status: AC

## 2019-02-20 MED ORDER — OXYMETAZOLINE HCL 0.05 % NA SOLN
NASAL | Status: DC | PRN
  Administered 2019-02-20: 1 via TOPICAL

## 2019-02-20 MED ORDER — LIDOCAINE 2% (20 MG/ML) 5 ML SYRINGE
INTRAMUSCULAR | Status: AC
  Filled 2019-02-20: qty 5

## 2019-02-20 MED ORDER — OXYCODONE HCL 5 MG PO TABS: 5.0000 mg | ORAL_TABLET | Freq: Once | ORAL | Status: DC | PRN

## 2019-02-20 MED ORDER — MUPIROCIN 2 % EX OINT
TOPICAL_OINTMENT | CUTANEOUS | Status: DC | PRN
  Administered 2019-02-20: 1 via NASAL

## 2019-02-20 MED ORDER — FENTANYL CITRATE (PF) 100 MCG/2ML IJ SOLN
25.0000 ug | INTRAMUSCULAR | Status: DC | PRN
  Administered 2019-02-20 (×2): 25 ug via INTRAVENOUS

## 2019-02-20 MED ORDER — MIDAZOLAM HCL 2 MG/2ML IJ SOLN: 1.0000 mg | INTRAMUSCULAR | Status: DC | PRN

## 2019-02-20 MED ORDER — FENTANYL CITRATE (PF) 100 MCG/2ML IJ SOLN
50.0000 ug | INTRAMUSCULAR | Status: AC | PRN
  Administered 2019-02-20: 50 ug via INTRAVENOUS
  Administered 2019-02-20 (×2): 25 ug via INTRAVENOUS

## 2019-02-20 MED ORDER — PROPOFOL 10 MG/ML IV BOLUS
INTRAVENOUS | Status: DC | PRN
  Administered 2019-02-20: 50 mg via INTRAVENOUS

## 2019-02-20 MED ORDER — CIPROFLOXACIN-FLUOCINOLONE PF 0.3-0.025 % OT SOLN
OTIC | Status: AC
  Filled 2019-02-20: qty 1

## 2019-02-20 MED ORDER — PROMETHAZINE HCL 25 MG/ML IJ SOLN: 6.2500 mg | INTRAMUSCULAR | Status: DC | PRN

## 2019-02-20 MED ORDER — PROPOFOL 500 MG/50ML IV EMUL
INTRAVENOUS | Status: AC
  Filled 2019-02-20: qty 50

## 2019-02-20 MED ORDER — OXYMETAZOLINE HCL 0.05 % NA SOLN
NASAL | Status: AC
  Filled 2019-02-20: qty 60

## 2019-02-20 MED ORDER — ACETAMINOPHEN 10 MG/ML IV SOLN: 1000.0000 mg | Freq: Once | INTRAVENOUS | Status: DC | PRN

## 2019-02-20 MED ORDER — DEXAMETHASONE SODIUM PHOSPHATE 4 MG/ML IJ SOLN
INTRAMUSCULAR | Status: DC | PRN
  Administered 2019-02-20: 10 mg via INTRAVENOUS

## 2019-02-20 MED ORDER — ONDANSETRON HCL 4 MG/2ML IJ SOLN
INTRAMUSCULAR | Status: AC
  Filled 2019-02-20: qty 2

## 2019-02-20 MED ORDER — SCOPOLAMINE 1 MG/3DAYS TD PT72: 1.0000 | MEDICATED_PATCH | Freq: Once | TRANSDERMAL | Status: DC | PRN

## 2019-02-20 MED ORDER — CEFAZOLIN SODIUM-DEXTROSE 1-4 GM/50ML-% IV SOLN
INTRAVENOUS | Status: DC | PRN
  Administered 2019-02-20: 1 g via INTRAVENOUS

## 2019-02-20 MED ORDER — MUPIROCIN 2 % EX OINT
TOPICAL_OINTMENT | CUTANEOUS | Status: AC
  Filled 2019-02-20: qty 22

## 2019-02-20 MED ORDER — LIDOCAINE HCL (CARDIAC) PF 100 MG/5ML IV SOSY
PREFILLED_SYRINGE | INTRAVENOUS | Status: DC | PRN
  Administered 2019-02-20: 60 mg via INTRAVENOUS

## 2019-02-20 MED ORDER — ONDANSETRON HCL 4 MG/2ML IJ SOLN
INTRAMUSCULAR | Status: DC | PRN
  Administered 2019-02-20: 4 mg via INTRAVENOUS

## 2019-02-20 MED ORDER — LIDOCAINE-EPINEPHRINE 1 %-1:100000 IJ SOLN
INTRAMUSCULAR | Status: AC
  Filled 2019-02-20: qty 1

## 2019-02-20 MED ORDER — DEXAMETHASONE SODIUM PHOSPHATE 10 MG/ML IJ SOLN
INTRAMUSCULAR | Status: AC
  Filled 2019-02-20: qty 1

## 2019-02-20 MED ORDER — SUGAMMADEX SODIUM 200 MG/2ML IV SOLN
INTRAVENOUS | Status: DC | PRN
  Administered 2019-02-20: 160 mg via INTRAVENOUS

## 2019-02-20 SURGICAL SUPPLY — 59 items
ATTRACTOMAT 16X20 MAGNETIC DRP (DRAPES) IMPLANT
BLADE RAD40 ROTATE 4M 4 5PK (BLADE) IMPLANT
BLADE RAD60 ROTATE M4 4 5PK (BLADE) IMPLANT
BLADE ROTATE RAD 12 4 M4 (BLADE) IMPLANT
BLADE ROTATE RAD 40 4 M4 (BLADE) IMPLANT
BLADE ROTATE TRICUT 4X13 M4 (BLADE) ×4 IMPLANT
BLADE TRICUT ROTATE M4 4 5PK (BLADE) IMPLANT
BUR HS RAD FRONTAL 3 (BURR) IMPLANT
CANISTER SUC SOCK COL 7IN (MISCELLANEOUS) ×4 IMPLANT
CANISTER SUCT 1200ML W/VALVE (MISCELLANEOUS) ×5 IMPLANT
COAGULATOR SUCT 8FR VV (MISCELLANEOUS) ×4 IMPLANT
COAGULATOR SUCT SWTCH 10FR 6 (ELECTROSURGICAL) IMPLANT
COVER WAND RF STERILE (DRAPES) IMPLANT
DECANTER SPIKE VIAL GLASS SM (MISCELLANEOUS) IMPLANT
DRSG NASAL KENNEDY LMNT 8CM (GAUZE/BANDAGES/DRESSINGS) IMPLANT
DRSG NASOPORE 8CM (GAUZE/BANDAGES/DRESSINGS) IMPLANT
DRSG TELFA 3X8 NADH (GAUZE/BANDAGES/DRESSINGS) IMPLANT
ELECT REM PT RETURN 9FT ADLT (ELECTROSURGICAL) ×4
ELECTRODE REM PT RTRN 9FT ADLT (ELECTROSURGICAL) ×3 IMPLANT
GLOVE BIO SURGEON STRL SZ 6.5 (GLOVE) ×1 IMPLANT
GLOVE BIO SURGEON STRL SZ7.5 (GLOVE) ×4 IMPLANT
GOWN STRL REUS W/ TWL LRG LVL3 (GOWN DISPOSABLE) ×6 IMPLANT
GOWN STRL REUS W/TWL LRG LVL3 (GOWN DISPOSABLE) ×2
HEMOSTAT SURGICEL 2X14 (HEMOSTASIS) IMPLANT
IV NS 1000ML (IV SOLUTION)
IV NS 1000ML BAXH (IV SOLUTION) IMPLANT
IV NS 500ML (IV SOLUTION) ×1
IV NS 500ML BAXH (IV SOLUTION) ×3 IMPLANT
NDL HYPO 25X1 1.5 SAFETY (NEEDLE) ×3 IMPLANT
NDL PRECISIONGLIDE 27X1.5 (NEEDLE) ×3 IMPLANT
NDL SPNL 25GX3.5 QUINCKE BL (NEEDLE) IMPLANT
NEEDLE HYPO 25X1 1.5 SAFETY (NEEDLE) ×4 IMPLANT
NEEDLE PRECISIONGLIDE 27X1.5 (NEEDLE) IMPLANT
NEEDLE SPNL 25GX3.5 QUINCKE BL (NEEDLE) IMPLANT
NS IRRIG 1000ML POUR BTL (IV SOLUTION) ×4 IMPLANT
PACK BASIN DAY SURGERY FS (CUSTOM PROCEDURE TRAY) ×4 IMPLANT
PACK ENT DAY SURGERY (CUSTOM PROCEDURE TRAY) ×4 IMPLANT
PAD DRESSING TELFA 3X8 NADH (GAUZE/BANDAGES/DRESSINGS) IMPLANT
SLEEVE SCD COMPRESS KNEE MED (MISCELLANEOUS) ×1 IMPLANT
SOLUTION BUTLER CLEAR DIP (MISCELLANEOUS) ×4 IMPLANT
SPLINT NASAL AIRWAY SILICONE (MISCELLANEOUS) ×1 IMPLANT
SPONGE GAUZE 2X2 8PLY STRL LF (GAUZE/BANDAGES/DRESSINGS) ×4 IMPLANT
SPONGE NEURO XRAY DETECT 1X3 (DISPOSABLE) ×4 IMPLANT
SUCTION FRAZIER HANDLE 10FR (MISCELLANEOUS)
SUCTION TUBE FRAZIER 10FR DISP (MISCELLANEOUS) IMPLANT
SUT CHROMIC 4 0 P 3 18 (SUTURE) ×4 IMPLANT
SUT PLAIN 4 0 ~~LOC~~ 1 (SUTURE) ×4 IMPLANT
SUT PROLENE 3 0 PS 2 (SUTURE) ×1 IMPLANT
SUT VIC AB 4-0 P-3 18XBRD (SUTURE) IMPLANT
SUT VIC AB 4-0 P3 18 (SUTURE)
SYR 50ML LL SCALE MARK (SYRINGE) ×4 IMPLANT
TOWEL GREEN STERILE FF (TOWEL DISPOSABLE) ×4 IMPLANT
TRACKER ENT INSTRUMENT (MISCELLANEOUS) ×4 IMPLANT
TRACKER ENT PATIENT (MISCELLANEOUS) ×4 IMPLANT
TUBE CONNECTING 20X1/4 (TUBING) ×4 IMPLANT
TUBE SALEM SUMP 12R W/ARV (TUBING) ×1 IMPLANT
TUBE SALEM SUMP 16 FR W/ARV (TUBING) ×3 IMPLANT
TUBING STRAIGHTSHOT EPS 5PK (TUBING) ×4 IMPLANT
YANKAUER SUCT BULB TIP NO VENT (SUCTIONS) ×4 IMPLANT

## 2019-02-20 NOTE — Anesthesia Procedure Notes (Signed)
Procedure Name: Intubation Performed by: Verita Lamb, CRNA Pre-anesthesia Checklist: Patient identified, Emergency Drugs available, Suction available, Patient being monitored and Timeout performed Patient Re-evaluated:Patient Re-evaluated prior to induction Oxygen Delivery Method: Circle system utilized Preoxygenation: Pre-oxygenation with 100% oxygen Induction Type: IV induction Ventilation: Mask ventilation without difficulty Laryngoscope Size: Mac and 3 Grade View: Grade II Tube type: Oral Tube size: 6.5 mm Number of attempts: 1 Airway Equipment and Method: Bougie stylet Placement Confirmation: ETT inserted through vocal cords under direct vision,  CO2 detector,  breath sounds checked- equal and bilateral and positive ETCO2 Secured at: 22 cm Tube secured with: Tape Dental Injury: Teeth and Oropharynx as per pre-operative assessment  Comments: Bougie used because patient very anterior, able to atraumatically intubated on first attempt using cricoid pressure and bougie.

## 2019-02-20 NOTE — Transfer of Care (Signed)
Immediate Anesthesia Transfer of Care Note  Patient: Nutritional therapist  Procedure(s) Performed: NASAL SEPTOPLASTY WITH  BILATERAL TURBINATE REDUCTION (Bilateral Nose) RIGHT TOTAL ETHMOIDECTOMY (Right Nose) ENDOSCOOPIC MAXILLARY ANTROSTOMY WITH TISSUE REMOVAL (N/A Nose) SINUS ENDO WITH FUSION (N/A Nose)  Patient Location: PACU  Anesthesia Type:General  Level of Consciousness: awake and alert   Airway & Oxygen Therapy: Patient Spontanous Breathing and Patient connected to face mask oxygen  Post-op Assessment: Report given to RN and Post -op Vital signs reviewed and stable  Post vital signs: Reviewed and stable  Last Vitals:  Vitals Value Taken Time  BP    Temp    Pulse 77 02/20/19 1039  Resp 14 02/20/19 1039  SpO2 98 % 02/20/19 1039  Vitals shown include unvalidated device data.  Last Pain:  Vitals:   02/20/19 0757  TempSrc: Oral  PainSc: 0-No pain         Complications: No apparent anesthesia complications

## 2019-02-20 NOTE — Anesthesia Postprocedure Evaluation (Signed)
Anesthesia Post Note  Patient: Nutritional therapist  Procedure(s) Performed: NASAL SEPTOPLASTY WITH  BILATERAL TURBINATE REDUCTION (Bilateral Nose) RIGHT TOTAL ETHMOIDECTOMY (Right Nose) ENDOSCOOPIC MAXILLARY ANTROSTOMY WITH TISSUE REMOVAL (N/A Nose) SINUS ENDO WITH FUSION (N/A Nose)     Patient location during evaluation: PACU Anesthesia Type: General Level of consciousness: awake and alert Pain management: pain level controlled Vital Signs Assessment: post-procedure vital signs reviewed and stable Respiratory status: spontaneous breathing, nonlabored ventilation and respiratory function stable Cardiovascular status: blood pressure returned to baseline and stable Postop Assessment: no apparent nausea or vomiting Anesthetic complications: no    Last Vitals:  Vitals:   02/20/19 1200 02/20/19 1237  BP: 135/75 (!) 156/75  Pulse: 79 79  Resp: (!) 21 20  Temp:  37.2 C  SpO2: 98% 98%    Last Pain:  Vitals:   02/20/19 1237  TempSrc:   PainSc: [place]

## 2019-02-20 NOTE — Discharge Instructions (Addendum)

## 2019-02-20 NOTE — Op Note (Signed)
DATE OF PROCEDURE: 02/20/2019  OPERATIVE REPORT   SURGEON: Newman PiesSu Nyzier Boivin, MD   PREOPERATIVE DIAGNOSES:  1. Severe nasal septal deviation.  2. Bilateral inferior turbinate hypertrophy.  3. Chronic nasal obstruction. 4. Chronic right maxillary and ethmoid sinusitis.  POSTOPERATIVE DIAGNOSES:  1. Severe nasal septal deviation.  2. Bilateral inferior turbinate hypertrophy.  3. Chronic nasal obstruction. 4. Chronic right maxillary and ethmoid sinusitis. 5. Right maxillary fungus ball.  PROCEDURE PERFORMED:  1. Septoplasty.  2. Bilateral partial inferior turbinate resection.  3. Endoscopic right total ethmoidectomy. 4. Endoscopic right maxillary antrostomy with tissue removal. 5. FUSION stereotactic image guidance.  ANESTHESIA: General endotracheal tube anesthesia.   COMPLICATIONS: None.   ESTIMATED BLOOD LOSS: 150 mL.   INDICATION FOR PROCEDURE: Danielle [name] is a 83 y.o. female with a history of chronic right maxillary and ethmoid sinusitis and chronic nasal obstruction. The patient was treated with multiple courses of antibiotics, antihistamine, decongestant, steroid nasal spray, and systemic steroids. However, the patient continued to be symptomatic. On examination, the patient was noted to have bilateral severe inferior turbinate hypertrophy and significant nasal septal deviation, causing significant nasal obstruction.  On her CT scan, significant opacification was noted within the right maxillary and ethmoid sinuses.  Based on the above findings, the decision was made for the patient to undergo the above-stated procedures. The risks, benefits, alternatives, and details of the procedures were discussed with the patient. Questions were invited and answered. Informed consent was obtained.   DESCRIPTION OF PROCEDURE: The patient was taken to the operating room and placed supine on the operating table. General endotracheal tube anesthesia was administered by the anesthesiologist. The patient  was positioned, and prepped and draped in the standard fashion for nasal surgery. Pledgets soaked with Afrin were placed in both nasal cavities for decongestion. The pledgets were subsequently removed. The FUSION stereotactic image guidance marker was placed. The image guidance system was functional throughout the case.   Examination of the nasal cavity revealed a severe nasal septal deviationd. 1% lidocaine with 1:100,000 epinephrine was injected onto the nasal septum bilaterally. A hemitransfixion incision was made on the left side. The mucosal flap was carefully elevated on the left side. A cartilaginous incision was made 1 cm superior to the caudal margin of the nasal septum. Mucosal flap was also elevated on the right side in the similar fashion. It should be noted that due to the severe septal deviation, the deviated portion of the cartilaginous and bony septum had to be removed in piecemeal fashion. Once the deviated portions were removed, a straight midline septum was achieved. The septum was then quilted with 4-0 plain gut sutures. The hemitransfixion incision was closed with interrupted 4-0 chromic sutures.   The inferior one half of both hypertrophied inferior turbinate was crossclamped with a Kelly clamp. The inferior one half of each inferior turbinate was then resected with a pair of cross cutting scissors. Hemostasis was achieved with a suction cautery device.   Using a 0 endoscope, the right nasal cavity was examined.  The right middle turbinate was medialized.  Polypoid tissue was noted within the middle meatus. The polypoid tissue was removed using a combination of microdebrider and Blakesley forceps. The uncinate process was resected with a freer elevator. The maxillary antrum was entered and enlarged using a combination of backbiter and microdebrider.  A large amount of fungus ball and polypoid tissue were removed from the right maxillary sinus.  Attention was then focused on the ethmoid  sinuses. The bony partitions of  the anterior and posterior ethmoid cavities were taken down. Polypoid tissue was noted and removed.  The sinuses were copiously irrigated with saline solution. Nasopore packing was placed. Doyle splints were applied to the nasal septum.  The care of the patient was turned over to the anesthesiologist. The patient was awakened from anesthesia without difficulty. The patient was extubated and transferred to the recovery room in good condition.   OPERATIVE FINDINGS: Severe nasal septal deviation and bilateral inferior turbinate hypertrophy. Chronic right maxillary and ethmoid sinusitis.  A large amount of fungus ball was noted within the right maxillary sinus.  SPECIMEN: Right sinus contents.  FOLLOWUP CARE: The patient be discharged home once she is awake and alert. The patient will be placed on Percocet 1 tablets p.o. q.4 hours p.r.n. pain, and amoxicillin 875 mg p.o. b.i.d. for 5 days. The patient will follow up in my office in approximately 3 days for splint removal.   Danielle [name] [name], MD

## 2019-02-20 NOTE — H&P (Signed)
Cc: Recurrent sinus infection, chronic nasal obstruction  HPI: The patient is an 83 year old female who returns today for her follow-up evaluation. The patient was noted to have chronic right maxillary and ethmoid sinusitis on her nasal endoscopy examination previously.  The patient has been symptomatic for almost 3 years.  She was treated with multiple courses of antibiotics, steroids and allergy medications.  The patient also has severe nasal septal deviation to the left.  The patient returns today complaining of persistent nasal congestion and foul smelling odor in her right nasal cavity.  Her last antibiotic only helped her temporarily for a few days.  She is interested in more definitive treatment.   Exam: The nasal cavities were decongested and anesthetised with a combination of oxymetazoline and 4% lidocaine solution.  The flexible scope was inserted into the right nasal cavity.  Endoscopy of the inferior and middle meatus was performed.  Edematous mucosa was noted. NSD to the left. Nasopharynx was clear.  Turbinates were hypertrophied but without mass.  Incomplete response to decongestion.  The procedure was repeated on the contralateral side.  The patient tolerated the procedure well.  Instructions were given to avoid eating or drinking for 2 hours.     Assessment: 1.  The patient's CT scan showed persistent right maxillary and ethmoid sinusitis.  Mucosal edema and opacification of her right maxillary and ethmoid sinuses are again noted.   2.  Chronic nasal obstruction, secondary to severe nasal septal deviation to the left and bilateral inferior turbinate hypertrophy.   Plan: 1.  The nasal endoscopy findings are reviewed with the patient.  The CT images are also reviewed.  2.  Based on the above findings, the patient will likely benefit from undergoing surgical intervention with septoplasty, turbinate reduction and right maxillary antrostomy and ethmoidectomy.  The risks, benefits,  alternatives and details of the procedures are reviewed with the patient.     3.  The patient would like to proceed with the procedures.

## 2019-02-21 ENCOUNTER — Encounter (HOSPITAL_BASED_OUTPATIENT_CLINIC_OR_DEPARTMENT_OTHER): Payer: Self-pay | Admitting: Otolaryngology

## 2019-02-23 DIAGNOSIS — J320 Chronic maxillary sinusitis: Secondary | ICD-10-CM | POA: Diagnosis not present

## 2019-02-23 DIAGNOSIS — J322 Chronic ethmoidal sinusitis: Secondary | ICD-10-CM | POA: Diagnosis not present

## 2019-03-13 DIAGNOSIS — J320 Chronic maxillary sinusitis: Secondary | ICD-10-CM | POA: Diagnosis not present

## 2019-03-13 DIAGNOSIS — J322 Chronic ethmoidal sinusitis: Secondary | ICD-10-CM | POA: Diagnosis not present

## 2019-04-03 DIAGNOSIS — J320 Chronic maxillary sinusitis: Secondary | ICD-10-CM | POA: Diagnosis not present

## 2019-04-03 DIAGNOSIS — J322 Chronic ethmoidal sinusitis: Secondary | ICD-10-CM | POA: Diagnosis not present

## 2019-05-25 DIAGNOSIS — Z23 Encounter for immunization: Secondary | ICD-10-CM | POA: Diagnosis not present

## 2019-07-12 DIAGNOSIS — E7800 Pure hypercholesterolemia, unspecified: Secondary | ICD-10-CM | POA: Diagnosis not present

## 2019-07-12 DIAGNOSIS — Z0000 Encounter for general adult medical examination without abnormal findings: Secondary | ICD-10-CM | POA: Diagnosis not present

## 2019-07-18 ENCOUNTER — Encounter: Payer: Self-pay | Admitting: Internal Medicine

## 2019-07-18 DIAGNOSIS — R7309 Other abnormal glucose: Secondary | ICD-10-CM | POA: Diagnosis not present

## 2019-07-19 DIAGNOSIS — Z0000 Encounter for general adult medical examination without abnormal findings: Secondary | ICD-10-CM | POA: Diagnosis not present

## 2019-07-19 DIAGNOSIS — E7800 Pure hypercholesterolemia, unspecified: Secondary | ICD-10-CM | POA: Diagnosis not present

## 2019-07-19 DIAGNOSIS — I10 Essential (primary) hypertension: Secondary | ICD-10-CM | POA: Diagnosis not present

## 2019-07-20 ENCOUNTER — Ambulatory Visit: Payer: Medicare Other | Admitting: Internal Medicine

## 2019-07-23 ENCOUNTER — Encounter: Payer: Self-pay | Admitting: Internal Medicine

## 2019-07-23 ENCOUNTER — Other Ambulatory Visit: Payer: Self-pay

## 2019-07-23 ENCOUNTER — Ambulatory Visit: Payer: Medicare Other | Admitting: Internal Medicine

## 2019-07-23 VITALS — BP 162/92 | HR 78 | Temp 98.1°F | Ht 59.0 in | Wt 85.4 lb

## 2019-07-23 DIAGNOSIS — I10 Essential (primary) hypertension: Secondary | ICD-10-CM

## 2019-07-23 DIAGNOSIS — R9431 Abnormal electrocardiogram [ECG] [EKG]: Secondary | ICD-10-CM | POA: Diagnosis not present

## 2019-07-23 NOTE — Patient Instructions (Signed)
Medication Instructions:  Your physician recommends that you continue on your current medications as directed. Please refer to the Current Medication list given to you today.  *If you need a refill on your cardiac medications before your next appointment, please call your pharmacy*  Lab Work: NONE If you have labs (blood work) drawn today and your tests are completely normal, you will receive your results only by: Marland Kitchen MyChart Message (if you have MyChart) OR . A paper copy in the mail If you have any lab test that is abnormal or we need to change your treatment, we will call you to review the results.  Testing/Procedures: NONE  Follow-Up: At [facility], you and your health needs are our priority.  As part of our continuing mission to provide you with exceptional heart care, we have created designated Provider Care Teams.  These Care Teams include your primary Cardiologist (physician) and Advanced Practice Providers (APPs -  Physician Assistants and Nurse Practitioners) who all work together to provide you with the care you need, when you need it.  Your next appointment:   1 month  The format for your next appointment:   Virtual Visit   Provider:   K. Mali Hilty, MD  Other Instructions

## 2019-07-23 NOTE — Progress Notes (Signed)
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 She had a complaint of chest heaviness at her last visit however that resolved and she's had no further episodes. She is continuing to be active.  Unfortunately, she recently suffered a torn left meniscus in her knee. She subsequently underwent arthroscopic surgery and has had marked improvement in her exercise ability. She underwent physical therapy and is now getting back to normal. Blood pressure remains well-controlled on low-dose amlodipine.  I had the pleasure of seeing Danielle [name] back in the office today.  She continues to do extremely well. She exercises regularly and denies any chest pain or worsening shortness of breath. Blood pressures been well controlled.  I reviewed recent laboratory work from her primary care provider which shows excellent cholesterol control and otherwise all labs are within normal limits.  At the pleasure see Danielle [name] here today in follow-up. She recent called in the office and noted that she's had some labile blood pressures. Her insurance company switched from [facility] to [employer]. When they filled her prescription she noted that she had a different generic for amlodipine. She said after taking that for 8-10 doses she felt some tightness in her chest, palpitations and noted her blood pressure gone up to 180. She then stopped the medicine and took some of her old prescription that she found. She called the office and requested a prescription for her old medication. She then switched to that with an improvement in blood pressure however she still has some symptoms and doesn't feel quite as well as she did several weeks ago. She reported the palpitations felt like a hard pounding in her chest. Her last stress test was in 2011 and  negative for ischemia.  06/11/2016  Danielle [name] returns today for follow-up. In April she had some palpitations and discomfort in her cst. She underwent a repea ischemia. She wore a monitor which showed some occasional PACs and PVCs but no A. Fib or other arrhythmias. Since then [facility] and has noted no problems with her generic amlodipine. Blood pressure is well-controlled today.  07/14/2017  Danielle [name] returns today for annual follow-up.  She denies any worsening palpitations, dizziness, chest pain or shortness of breath.  She continues to exercise.  She is mostly struggling with some jaw pain after a recent root canal.  She says she is scheduled to have a CT scan to further investigate that.  From a cardiac standpoint however she seems to be fairly stable.  Blood pressure remains well controlled on amlodipine.  She also had recent labs at Dr. Julianne Rice office which were all within normal limits.  Her serum glucose was 99, serum creatinine 0.7, hemoglobin A1c 5.4 and stable in total control 215, HDL 85, LDL 112 and triglycerides of 90.  07/14/2018  Danielle [name] seen today for annual follow-up.  I saw her exactly a year ago.  She was having some problems with jaw pain.  Ultimately she was found to have a cracked tooth and underwent to root canal.  Her symptoms improved after that.  She was also having some sinus congestion.  Because of her mouth pain she actually had trouble eating and has lost more weight.  Her BMI now is 18.  EKG today is mildly abnormal showing sinus rhythm with LVH  by voltage and some nonspecific lateral T wave changes.  This was not present last year, however may represent a strain pattern.  She is completely asymptomatic.  She continues with silver sneakers and exercises several times a week without symptoms.  07/23/2019  Danielle [name] returns today for follow-up.  Overall she is doing well.  She recently had surgery in June which included a nasal septoplasty,  bilateral partial inferior turbinate resection, tissue removal and removal of a maxillary fungus ball.  Overall she has had significant improvement in her breathing and has her smell improved.  All this may have stemmed from a root canal which apparently punctured into her sinus.  It is noted that her blood pressure is elevated today.  We rechecked that it was 164/95.  She has a blood pressure cuff but is more than 81 years old.  I encouraged her to get a new cuff for more accuracy.  She is advised then to follow blood pressures over the next few weeks.  PMHx:  Past Medical History:  Diagnosis Date  . Atypical chest pain   . Dyslipidemia   . History of nuclear stress test 03/18/2010   dipyridamole; normal pattern of perfusion, low risk, normal study   . Hypertension     Past Surgical History:  Procedure Laterality Date  . DILATION AND CURETTAGE OF UTERUS    . ETHMOIDECTOMY Right 02/20/2019   Procedure: RIGHT TOTAL ETHMOIDECTOMY;  Surgeon: Newman Pieseoh, Su, MD;  Location: [facility];  Service: ENT;  Laterality: Right;  . MAXILLARY ANTROSTOMY N/A 02/20/2019   Procedure: ENDOSCOOPIC MAXILLARY ANTROSTOMY WITH TISSUE REMOVAL;  Surgeon: Newman Pieseoh, Su, MD;  Location: [facility];  Service: ENT;  Laterality: N/A;  . NASAL SEPTOPLASTY W/ TURBINOPLASTY Bilateral 02/20/2019   Procedure: NASAL SEPTOPLASTY WITH  BILATERAL TURBINATE REDUCTION;  Surgeon: Newman Pieseoh, Su, MD;  Location: [facility];  Service: ENT;  Laterality: Bilateral;  . SINUS ENDO WITH FUSION N/A 02/20/2019   Procedure: SINUS ENDO WITH FUSION;  Surgeon: Newman Pieseoh, Su, MD;  Location: [facility];  Service: ENT;  Laterality: N/A;  . TUBAL LIGATION      FAMHx:  Family History  Problem Relation Age of Onset  . Cancer Father   . Heart Problems Brother   . Hypertension Brother     SOCHx:   reports that she has never smoked. She has never used smokeless tobacco. She reports current alcohol use of about 1.0  standard drinks of alcohol per week. She reports that she does not use drugs.  ALLERGIES:  No Known Allergies  ROS: Pertinent items noted in HPI and remainder of comprehensive ROS otherwise negative.  HOME MEDS: Current Outpatient Medications  Medication Sig Dispense Refill  . acetaminophen (TYLENOL) 500 MG tablet Take 1,000 mg by mouth at bedtime as needed.    Marland Kitchen. amLODipine (NORVASC) 5 MG tablet Take 1 tablet (5 mg total) by mouth daily. 90 tablet 3  . b complex vitamins tablet Take 1 tablet by mouth daily.    . chlorpheniramine (CHLOR-TRIMETON) 4 MG tablet Take 4 mg by mouth 2 (two) times daily as needed for allergies.    . Cholecalciferol (VITAMIN D-3 PO) Take 1 tablet by mouth daily.     . magnesium 30 MG tablet Take 30 mg by mouth 2 (two) times daily.    . Multiple Vitamins-Minerals (MULTIVITAMIN PO) Take by mouth daily.    . Potassium (POTASSIMIN PO) Take 1 tablet by mouth daily.    .Marland Kitchen  Turmeric (QC TUMERIC COMPLEX) 500 MG CAPS Take by mouth.     No current facility-administered medications for this visit.     LABS/IMAGING: No results found for this or any previous visit (from the past 48 hour(s)). No results found.  VITALS: BP (!) 162/92   Pulse 78   Temp 98.1 F (36.7 C)   Ht 4\' 11"  (1.499 m)   Wt 85 lb 6.4 oz (38.7 kg)   SpO2 100%   BMI 17.25 kg/m   EXAM: General appearance: alert and no distress Neck: no adenopathy, no carotid bruit, no JVD, supple, symmetrical, trachea midline and thyroid not enlarged, symmetric, no tenderness/[name]/nodules Lungs: clear to auscultation bilaterally Heart: regular rate and rhythm, S1, S2 normal, no murmur, click, rub or gallop Abdomen: soft, non-tender; bowel sounds normal; no masses,  no organomegaly Extremities: extremities normal, atraumatic, no cyanosis or edema Pulses: 2+ and symmetric Skin: Skin color, texture, turgor normal. No rashes or lesions Neurologic: Grossly normal  EKG: Sinus rhythm at 78, voltage criteria for  LVH (suspect due to being very thin)-personally reviewed  ASSESSMENT: 1. Chest pain - low risk GXT 12/2015 2. Palpitations - occasional PAC's/PVC's 3. Hypertension-controlled 4. LVH with lateral T wave changes -suspect that high voltages are due to her being very thin  PLAN: 1.   Danielle [name] had had some significant symptoms after being treated for jaw pain.  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  She underwent surgery for this in June and is much improved.  Her blood pressure is elevated today.  She said that recently had been normal and her PCPs office.  I advised her to get a new blood pressure cuff and follow-up closely  Follow-up with me via telephone visit in 1 month.  July, MD, [facility], FACP    [facility]  Medical Director of the Advanced Lipid Disorders &  Cardiovascular Risk Reduction Clinic Diplomate of the American Board of Clinical Lipidology Attending Cardiologist  Direct Dial: [phone]  Fax: [phone]  Website:  [phone]  07/23/2019, 3:26 PM

## 2019-07-31 DIAGNOSIS — J322 Chronic ethmoidal sinusitis: Secondary | ICD-10-CM | POA: Diagnosis not present

## 2019-07-31 DIAGNOSIS — J320 Chronic maxillary sinusitis: Secondary | ICD-10-CM | POA: Diagnosis not present

## 2019-07-31 DIAGNOSIS — J310 Chronic rhinitis: Secondary | ICD-10-CM | POA: Diagnosis not present

## 2019-08-11 ENCOUNTER — Other Ambulatory Visit: Payer: Self-pay | Admitting: Internal Medicine

## 2019-08-23 ENCOUNTER — Encounter: Payer: Self-pay | Admitting: *Deleted

## 2019-08-23 ENCOUNTER — Telehealth (INDEPENDENT_AMBULATORY_CARE_PROVIDER_SITE_OTHER): Payer: Medicare Other | Admitting: Internal Medicine

## 2019-08-23 ENCOUNTER — Encounter: Payer: Self-pay | Admitting: Internal Medicine

## 2019-08-23 VITALS — BP 123/66 | HR 82

## 2019-08-23 DIAGNOSIS — R002 Palpitations: Secondary | ICD-10-CM

## 2019-08-23 DIAGNOSIS — I10 Essential (primary) hypertension: Secondary | ICD-10-CM | POA: Diagnosis not present

## 2019-08-23 NOTE — Patient Instructions (Signed)
Medication Instructions:  Your physician recommends that you continue on your current medications as directed. Please refer to the Current Medication list given to you today.  *If you need a refill on your cardiac medications before your next appointment, please call your pharmacy*  Follow-Up: At [facility], you and your health needs are our priority.  As part of our continuing mission to provide you with exceptional heart care, we have created designated Provider Care Teams.  These Care Teams include your primary Cardiologist (physician) and Advanced Practice Providers (APPs -  Physician Assistants and Nurse Practitioners) who all work together to provide you with the care you need, when you need it.  Your next appointment:   12 month(s)  The format for your next appointment:   Either In Person or Virtual  Provider:   You may see Dr. Hilty or one of the following Advanced Practice Providers on your designated Care Team:    Hao Meng, PA-C  Angela Duke, PA-C or   Krista Kroeger, PA-C   Other Instructions   

## 2019-08-23 NOTE — Progress Notes (Signed)
Virtual Visit via Telephone Note   This visit type was conducted due to national recommendations for restrictions regarding the COVID-19 Pandemic (e.g. social distancing) in an effort to limit this patient's exposure and mitigate transmission in our community.  Due to her co-morbid illnesses, this patient is at least at moderate risk for complications without adequate follow up.  This format is felt to be most appropriate for this patient at this time.  The patient did not have access to video technology/had technical difficulties with video requiring transitioning to audio format only (telephone).  All issues noted in this document were discussed and addressed.  No physical exam could be performed with this format.  Please refer to the patient's chart for her  consent to telehealth for [facility].   Evaluation Performed:  Telephone follow-up  Date:  08/23/2019   ID:  Danielle [name], DOB 1936/07/08, MRN [id number]  Patient Location:  [address] [place] [address]  Provider location:   [address], [address] [place], [place] [address]  PCP:  Danielle Grippe, MD  Cardiologist:  No primary care provider on file. Electrophysiologist:  None   Chief Complaint:  Follow-up hypertension  History of Present Illness:    Danielle [name] is a 83 y.o. female who presents via Web designer for a telehealth visit today.  Ms.  [name] returns today for follow-up.  I saw her recently in the office and blood pressure was elevated.  She did not regularly follow at home.  I advised her to purchase a blood pressure cuff and take some readings at home.  She reviewed those with me today and her blood pressure today was 120/66.  Her home blood pressure readings have generally been in the low 100s to 120s.  She only had 2 days where her systolics were in the 130-140 range.  In general this seems to be well controlled.  She is exercising regularly but wants to go back to the [facility] and may need a  note to allow that.  The patient does not have symptoms concerning for COVID-19 infection (fever, chills, cough, or new SHORTNESS OF BREATH).    Prior CV studies:   The following studies were reviewed today:  Chart reviewed  PMHx:  Past Medical History:  Diagnosis Date  . Atypical chest pain   . Dyslipidemia   . History of nuclear stress test 03/18/2010   dipyridamole; normal pattern of perfusion, low risk, normal study   . Hypertension     Past Surgical History:  Procedure Laterality Date  . DILATION AND CURETTAGE OF UTERUS    . ETHMOIDECTOMY Right 02/20/2019   Procedure: RIGHT TOTAL ETHMOIDECTOMY;  Surgeon: Newman Pies, MD;  Location: [facility];  Service: ENT;  Laterality: Right;  . MAXILLARY ANTROSTOMY N/A 02/20/2019   Procedure: ENDOSCOOPIC MAXILLARY ANTROSTOMY WITH TISSUE REMOVAL;  Surgeon: Newman Pies, MD;  Location: [facility];  Service: ENT;  Laterality: N/A;  . NASAL SEPTOPLASTY W/ TURBINOPLASTY Bilateral 02/20/2019   Procedure: NASAL SEPTOPLASTY WITH  BILATERAL TURBINATE REDUCTION;  Surgeon: Newman Pies, MD;  Location: [facility];  Service: ENT;  Laterality: Bilateral;  . SINUS ENDO WITH FUSION N/A 02/20/2019   Procedure: SINUS ENDO WITH FUSION;  Surgeon: Newman Pies, MD;  Location: [facility];  Service: ENT;  Laterality: N/A;  . TUBAL LIGATION      FAMHx:  Family History  Problem Relation Age of Onset  . Cancer Father   . Heart Problems Brother   .  Hypertension Brother     SOCHx:   reports that she has never smoked. She has never used smokeless tobacco. She reports current alcohol use of about 1.0 standard drinks of alcohol per week. She reports that she does not use drugs.  ALLERGIES:  No Known Allergies  MEDS:  Current Meds  Medication Sig  . acetaminophen (TYLENOL) 500 MG tablet Take 1,000 mg by mouth at bedtime as needed.  Marland Kitchen amLODipine (NORVASC) 5 MG tablet TAKE 1 TABLET BY MOUTH EVERY DAY  . b complex  vitamins tablet Take 1 tablet by mouth daily.  . chlorpheniramine (CHLOR-TRIMETON) 4 MG tablet Take 4 mg by mouth 2 (two) times daily as needed for allergies.  . Cholecalciferol (VITAMIN D-3 PO) Take 1 tablet by mouth daily.   . magnesium 30 MG tablet Take 30 mg by mouth 2 (two) times daily.  . Multiple Vitamins-Minerals (MULTIVITAMIN PO) Take by mouth daily.  . Potassium (POTASSIMIN PO) Take 1 tablet by mouth daily.  . [DISCONTINUED] Turmeric (QC TUMERIC COMPLEX) 500 MG CAPS Take by mouth.     ROS: Pertinent items noted in HPI and remainder of comprehensive ROS otherwise negative.  Labs/Other Tests and Data Reviewed:    Recent Labs: No results found for requested labs within last 8760 hours.   Recent Lipid Panel No results found for: CHOL, TRIG, HDL, CHOLHDL, LDLCALC, LDLDIRECT  Wt Readings from Last 3 Encounters:  07/23/19 85 lb 6.4 oz (38.7 kg)  02/20/19 83 lb 8.9 oz (37.9 kg)  12/20/18 88 lb (39.9 kg)     Exam:    Vital Signs:  BP 123/66   Pulse 82    Exam not performed due to telephone visit  ASSESSMENT & PLAN:    1. Essential hypertesion 2. Palpitations  [name] has good blood pressure control at home.  I encouraged her to continue to follow her blood pressures periodically.  She should continue her exercise and walking and will provide a note for her to return to the [facility] if she wishes.  COVID-19 Education: The signs and symptoms of COVID-19 were discussed with the patient and how to seek care for testing (follow up with PCP or arrange E-visit).  The importance of social distancing was discussed today.  Patient Risk:   After full review of this patients clinical status, I feel that they are at least moderate risk at this time.  Time:   Today, I have spent 15 minutes with the patient with telehealth technology discussing hypertension.     Medication Adjustments/Labs and Tests Ordered: Current medicines are reviewed at length with the patient today.   Concerns regarding medicines are outlined above.   Tests Ordered: No orders of the defined types were placed in this encounter.   Medication Changes: No orders of the defined types were placed in this encounter.   Disposition:  in 1 year(s)  Pixie Casino, MD, [facility], [place] Director of the Advanced Lipid Disorders &  Cardiovascular Risk Reduction Clinic Diplomate of the American Board of Clinical Lipidology Attending Cardiologist  Direct Dial: [phone]  Fax: [phone]  Website:  [email or web address]  Pixie Casino, MD  08/23/2019 8:06 AM

## 2019-09-03 DIAGNOSIS — H524 Presbyopia: Secondary | ICD-10-CM | POA: Diagnosis not present

## 2019-09-18 ENCOUNTER — Ambulatory Visit: Payer: Medicare Other | Attending: Internal Medicine

## 2019-09-18 DIAGNOSIS — Z23 Encounter for immunization: Secondary | ICD-10-CM

## 2019-09-18 NOTE — Progress Notes (Signed)
   Covid-19 Vaccination Clinic  Name:  Dellene Mcgroarty    MRN: [id number] DOB: 1936-06-20  09/18/2019  Ms. Pyeatt was observed post Covid-19 immunization for 15 minutes without incidence. She was provided with Vaccine Information Sheet and instruction to access the V-Safe system.   Ms. Sassano was instructed to call 911 with any severe reactions post vaccine: Marland Kitchen Difficulty breathing  . Swelling of your face and throat  . A fast heartbeat  . A bad rash all over your body  . Dizziness and weakness    Immunizations Administered    Name Date Dose VIS Date Route   Pfizer COVID-19 Vaccine 09/18/2019  8:39 AM 0.3 mL 08/17/2019 Intramuscular   Manufacturer: [employer], [employer]   Lot: [id number]   NDC: [address]-0

## 2019-10-06 ENCOUNTER — Ambulatory Visit: Payer: Medicare Other | Attending: Internal Medicine

## 2019-10-06 ENCOUNTER — Ambulatory Visit: Payer: Medicare Other

## 2019-10-06 DIAGNOSIS — Z23 Encounter for immunization: Secondary | ICD-10-CM | POA: Insufficient documentation

## 2019-10-06 NOTE — Progress Notes (Signed)
   Covid-19 Vaccination Clinic  Name:  Danielle [name]    MRN: [id number] DOB: 1936/05/31  10/06/2019  [name] was observed post Covid-19 immunization for 15 minutes without incidence. She was provided with Vaccine Information Sheet and instruction to access the V-Safe system.   [name] was instructed to call 911 with any severe reactions post vaccine: Marland Kitchen Difficulty breathing  . Swelling of your face and throat  . A fast heartbeat  . A bad rash all over your body  . Dizziness and weakness    Immunizations Administered    Name Date Dose VIS Date Route   Pfizer COVID-19 Vaccine 10/06/2019 11:25 AM 0.3 mL 08/17/2019 Intramuscular   Manufacturer: [employer], [employer]   Lot: [id number]   NDC: [address]-2

## 2020-01-29 DIAGNOSIS — J320 Chronic maxillary sinusitis: Secondary | ICD-10-CM | POA: Diagnosis not present

## 2020-01-29 DIAGNOSIS — J322 Chronic ethmoidal sinusitis: Secondary | ICD-10-CM | POA: Diagnosis not present

## 2020-01-29 DIAGNOSIS — J310 Chronic rhinitis: Secondary | ICD-10-CM | POA: Diagnosis not present

## 2020-06-03 ENCOUNTER — Ambulatory Visit: Payer: Medicare Other | Attending: Internal Medicine

## 2020-06-03 DIAGNOSIS — Z23 Encounter for immunization: Secondary | ICD-10-CM

## 2020-06-03 NOTE — Progress Notes (Signed)
° °  Covid-19 Vaccination Clinic  Name:  Chenise Mulvihill    MRN: [id number] DOB: 1936/08/10  06/03/2020  Ms. Lever was observed post Covid-19 immunization for 15 minutes without incident. She was provided with Vaccine Information Sheet and instruction to access the V-Safe system.   Ms. Yarbro was instructed to call 911 with any severe reactions post vaccine:  Difficulty breathing   Swelling of face and throat   A fast heartbeat   A bad rash all over body   Dizziness and weakness

## 2020-06-27 ENCOUNTER — Encounter: Payer: Self-pay | Admitting: Internal Medicine

## 2020-07-16 DIAGNOSIS — I10 Essential (primary) hypertension: Secondary | ICD-10-CM | POA: Diagnosis not present

## 2020-07-16 DIAGNOSIS — Z0000 Encounter for general adult medical examination without abnormal findings: Secondary | ICD-10-CM | POA: Diagnosis not present

## 2020-07-23 DIAGNOSIS — M19012 Primary osteoarthritis, left shoulder: Secondary | ICD-10-CM | POA: Diagnosis not present

## 2020-07-23 DIAGNOSIS — I10 Essential (primary) hypertension: Secondary | ICD-10-CM | POA: Diagnosis not present

## 2020-07-23 DIAGNOSIS — M810 Age-related osteoporosis without current pathological fracture: Secondary | ICD-10-CM | POA: Diagnosis not present

## 2020-07-23 DIAGNOSIS — M79602 Pain in left arm: Secondary | ICD-10-CM | POA: Diagnosis not present

## 2020-07-23 DIAGNOSIS — Z0000 Encounter for general adult medical examination without abnormal findings: Secondary | ICD-10-CM | POA: Diagnosis not present

## 2020-07-23 DIAGNOSIS — M19022 Primary osteoarthritis, left elbow: Secondary | ICD-10-CM | POA: Diagnosis not present

## 2020-08-05 DIAGNOSIS — J310 Chronic rhinitis: Secondary | ICD-10-CM | POA: Diagnosis not present

## 2020-08-05 DIAGNOSIS — J320 Chronic maxillary sinusitis: Secondary | ICD-10-CM | POA: Diagnosis not present

## 2020-08-05 DIAGNOSIS — J322 Chronic ethmoidal sinusitis: Secondary | ICD-10-CM | POA: Diagnosis not present

## 2020-08-27 ENCOUNTER — Ambulatory Visit: Payer: Medicare Other | Admitting: Internal Medicine

## 2020-09-04 DIAGNOSIS — H524 Presbyopia: Secondary | ICD-10-CM | POA: Diagnosis not present

## 2020-09-18 ENCOUNTER — Ambulatory Visit: Payer: Medicare Other | Admitting: Internal Medicine

## 2020-10-06 ENCOUNTER — Other Ambulatory Visit: Payer: Self-pay

## 2020-10-06 ENCOUNTER — Ambulatory Visit: Payer: Medicare Other | Admitting: Internal Medicine

## 2020-10-06 ENCOUNTER — Encounter: Payer: Self-pay | Admitting: Internal Medicine

## 2020-10-06 VITALS — BP 118/76 | HR 74 | Ht 59.0 in | Wt 87.8 lb

## 2020-10-06 DIAGNOSIS — R002 Palpitations: Secondary | ICD-10-CM

## 2020-10-06 DIAGNOSIS — I10 Essential (primary) hypertension: Secondary | ICD-10-CM | POA: Diagnosis not present

## 2020-10-06 DIAGNOSIS — R9431 Abnormal electrocardiogram [ECG] [EKG]: Secondary | ICD-10-CM | POA: Diagnosis not present

## 2020-10-06 NOTE — Patient Instructions (Addendum)
Medication Instructions:  Your physician recommends that you continue on your current medications as directed. Please refer to the Current Medication list given to you today.  *If you need a refill on your cardiac medications before your next appointment, please call your pharmacy*  Follow-Up: At [facility], you and your health needs are our priority.  As part of our continuing mission to provide you with exceptional heart care, we have created designated Provider Care Teams.  These Care Teams include your primary Cardiologist (physician) and Advanced Practice Providers (APPs -  Physician Assistants and Nurse Practitioners) who all work together to provide you with the care you need, when you need it.  We recommend signing up for the patient portal called "MyChart".  Sign up information is provided on this After Visit Summary.  MyChart is used to connect with patients for Virtual Visits (Telemedicine).  Patients are able to view lab/test results, encounter notes, upcoming appointments, etc.  Non-urgent messages can be sent to your provider as well.   To learn more about what you can do with MyChart, go to [email or web address].    Your next appointment:   12 month(s)  The format for your next appointment:   In Person  Provider:   You may see Dr. Rennis Golden or one of the following Advanced Practice Providers on your designated Care Team:    Azalee Course, PA-C  Micah Flesher, [place] or   Judy Pimple, [place]    Other Instructions If you need to change pharmacies, please contact the new preferred pharmacy and they will contact your former pharmacy and obtain prescriptions

## 2020-10-06 NOTE — Progress Notes (Signed)
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 She had a complaint of chest heaviness at her last visit however that resolved and she's had no further episodes. She is continuing to be active.  Unfortunately, she recently suffered a torn left meniscus in her knee. She subsequently underwent arthroscopic surgery and has had marked improvement in her exercise ability. She underwent physical therapy and is now getting back to normal. Blood pressure remains well-controlled on low-dose amlodipine.  I had the pleasure of seeing Danielle [name] back in the office today.  She continues to do extremely well. She exercises regularly and denies any chest pain or worsening shortness of breath. Blood pressures been well controlled.  I reviewed recent laboratory work from her primary care provider which shows excellent cholesterol control and otherwise all labs are within normal limits.  At the pleasure see Danielle [name] here today in follow-up. She recent called in the office and noted that she's had some labile blood pressures. Her insurance company switched from [facility] to [facility]. When they filled her prescription she noted that she had a different generic for amlodipine. She said after taking that for 8-10 doses she felt some tightness in her chest, palpitations and noted her blood pressure gone up to 180. She then stopped the medicine and took some of her old prescription that she found. She called the office and requested a prescription for her old medication. She then switched to that with an improvement in blood pressure however she still has some symptoms and doesn't feel quite as well as she did several weeks ago. She reported the palpitations felt like a hard pounding in her chest. Her last stress test was in 2011 and  negative for ischemia.  06/11/2016  Danielle [name] returns today for follow-up. In April she had some palpitations and discomfort in her cst. She underwent a repea ischemia. She wore a monitor which showed some occasional PACs and PVCs but no A. Fib or other arrhythmias. Since then [facility] and has noted no problems with her generic amlodipine. Blood pressure is well-controlled today.  07/14/2017  Danielle [name] returns today for annual follow-up.  She denies any worsening palpitations, dizziness, chest pain or shortness of breath.  She continues to exercise.  She is mostly struggling with some jaw pain after a recent root canal.  She says she is scheduled to have a CT scan to further investigate that.  From a cardiac standpoint however she seems to be fairly stable.  Blood pressure remains well controlled on amlodipine.  She also had recent labs at Dr. Julianne Rice office which were all within normal limits.  Her serum glucose was 99, serum creatinine 0.7, hemoglobin A1c 5.4 and stable in total control 215, HDL 85, LDL 112 and triglycerides of 90.  07/14/2018  Danielle [name] seen today for annual follow-up.  I saw her exactly a year ago.  She was having some problems with jaw pain.  Ultimately she was found to have a cracked tooth and underwent to root canal.  Her symptoms improved after that.  She was also having some sinus congestion.  Because of her mouth pain she actually had trouble eating and has lost more weight.  Her BMI now is 18.  EKG today is mildly abnormal showing sinus rhythm with LVH  by voltage and some nonspecific lateral T wave changes.  This was not present last year, however may represent a strain pattern.  She is completely asymptomatic.  She continues with silver sneakers and exercises several times a week without symptoms.  07/23/2019  Danielle [name] returns today for follow-up.  Overall she is doing well.  She recently had surgery in June which included a nasal septoplasty,  bilateral partial inferior turbinate resection, tissue removal and removal of a maxillary fungus ball.  Overall she has had significant improvement in her breathing and has her smell improved.  All this may have stemmed from a root canal which apparently punctured into her sinus.  It is noted that her blood pressure is elevated today.  We rechecked that it was 164/95.  She has a blood pressure cuff but is more than 83 years old.  I encouraged her to get a new cuff for more accuracy.  She is advised then to follow blood pressures over the next few weeks.  09/18/2020  Danielle [name] is seen today for follow-up.  Overall she continues to do well.  She is exercising regularly again.  Her weight got a little low but it started to come back up.  Blood pressure is well controlled.  Lipids were performed by her PCP in November showing total cholesterol 219, HDL high at 81, LDL 125 and triglycerides 72.  Overall felt to be a favorable lipid profile.  PMHx:  Past Medical History:  Diagnosis Date  . Atypical chest pain   . Dyslipidemia   . History of nuclear stress test 03/18/2010   dipyridamole; normal pattern of perfusion, low risk, normal study   . Hypertension     Past Surgical History:  Procedure Laterality Date  . DILATION AND CURETTAGE OF UTERUS    . ETHMOIDECTOMY Right 02/20/2019   Procedure: RIGHT TOTAL ETHMOIDECTOMY;  Surgeon: Newman Pies, MD;  Location: [facility];  Service: ENT;  Laterality: Right;  . MAXILLARY ANTROSTOMY N/A 02/20/2019   Procedure: ENDOSCOOPIC MAXILLARY ANTROSTOMY WITH TISSUE REMOVAL;  Surgeon: Newman Pies, MD;  Location: [facility];  Service: ENT;  Laterality: N/A;  . NASAL SEPTOPLASTY W/ TURBINOPLASTY Bilateral 02/20/2019   Procedure: NASAL SEPTOPLASTY WITH  BILATERAL TURBINATE REDUCTION;  Surgeon: Newman Pies, MD;  Location: [facility];  Service: ENT;  Laterality: Bilateral;  . SINUS ENDO WITH FUSION N/A 02/20/2019   Procedure: SINUS ENDO  WITH FUSION;  Surgeon: Newman Pies, MD;  Location: [facility];  Service: ENT;  Laterality: N/A;  . TUBAL LIGATION      FAMHx:  Family History  Problem Relation Age of Onset  . Cancer Father   . Heart Problems Brother   . Hypertension Brother     SOCHx:   reports that she has never smoked. She has never used smokeless tobacco. She reports current alcohol use of about 1.0 standard drink of alcohol per week. She reports that she does not use drugs.  ALLERGIES:  No Known Allergies  ROS: Pertinent items noted in HPI and remainder of comprehensive ROS otherwise negative.  HOME MEDS: Current Outpatient Medications  Medication Sig Dispense Refill  . amLODipine (NORVASC) 5 MG tablet TAKE 1 TABLET BY MOUTH EVERY DAY 90 tablet 3  . b complex vitamins tablet Take 1 tablet by mouth daily.    . chlorpheniramine (CHLOR-TRIMETON) 4 MG tablet Take 4 mg by mouth 2 (two) times daily as needed for allergies.    . Cholecalciferol (VITAMIN D-3  PO) Take 1 tablet by mouth daily.     . magnesium 30 MG tablet Take 30 mg by mouth 2 (two) times daily.    . Multiple Vitamins-Minerals (MULTIVITAMIN PO) Take by mouth daily.    . Potassium (POTASSIMIN PO) Take 1 tablet by mouth daily.    . Turmeric (QC TUMERIC COMPLEX PO) Take by mouth.    Marland Kitchen acetaminophen (TYLENOL) 500 MG tablet Take 1,000 mg by mouth at bedtime as needed. (Patient not taking: Reported on 10/06/2020)     No current facility-administered medications for this visit.    LABS/IMAGING: No results found for this or any previous visit (from the past 48 hour(s)). No results found.  VITALS: BP 118/76 (BP Location: Left Arm, Patient Position: Sitting)   Pulse 74   Ht 4\' 11"  (1.499 m)   Wt 87 lb 12.8 oz (39.8 kg)   SpO2 98%   BMI 17.73 kg/m   EXAM: General appearance: alert and no distress Neck: no adenopathy, no carotid bruit, no JVD, supple, symmetrical, trachea midline and thyroid not enlarged, symmetric, no  tenderness/[name]/nodules Lungs: clear to auscultation bilaterally Heart: regular rate and rhythm, S1, S2 normal, no murmur, click, rub or gallop Abdomen: soft, non-tender; bowel sounds normal; no masses,  no organomegaly Extremities: extremities normal, atraumatic, no cyanosis or edema Pulses: 2+ and symmetric Skin: Skin color, texture, turgor normal. No rashes or lesions Neurologic: Grossly normal  EKG: Sinus rhythm at 74, possible left atrial enlargement-personally reviewed  ASSESSMENT: 1. Chest pain - low risk GXT 12/2015 2. Palpitations - occasional PAC's/PVC's 3. Hypertension-controlled 4. LVH with lateral T wave changes -suspect that high voltages are due to her being very thin  PLAN: 1.   Danielle [name] continues to do well.  She denies any chest pain or palpitations.  Blood pressures well controlled.  EKG is stable and not related I think to any LVH based on more body habitus and the fact that she is thin.  She exercises regularly and has no complaints.  We will continue her current medications.  Plan follow-up with me annually or sooner as necessary.  Minette Headland, MD, [facility], FACP  [place]  [facility]  Medical Director of the Advanced Lipid Disorders &  Cardiovascular Risk Reduction Clinic Diplomate of the American Board of Clinical Lipidology Attending Cardiologist  Direct Dial: [phone]  Fax: [phone]  Website:  [email or web address] 10/06/2020, 9:31 AM

## 2020-11-18 ENCOUNTER — Ambulatory Visit: Payer: Medicare Other | Admitting: Internal Medicine

## 2021-02-03 DIAGNOSIS — J322 Chronic ethmoidal sinusitis: Secondary | ICD-10-CM | POA: Diagnosis not present

## 2021-02-03 DIAGNOSIS — J320 Chronic maxillary sinusitis: Secondary | ICD-10-CM | POA: Diagnosis not present

## 2021-02-03 DIAGNOSIS — J310 Chronic rhinitis: Secondary | ICD-10-CM | POA: Diagnosis not present

## 2021-03-13 DIAGNOSIS — H52203 Unspecified astigmatism, bilateral: Secondary | ICD-10-CM | POA: Diagnosis not present

## 2021-07-17 DIAGNOSIS — I10 Essential (primary) hypertension: Secondary | ICD-10-CM | POA: Diagnosis not present

## 2021-07-17 DIAGNOSIS — E7800 Pure hypercholesterolemia, unspecified: Secondary | ICD-10-CM | POA: Diagnosis not present

## 2021-07-17 DIAGNOSIS — M810 Age-related osteoporosis without current pathological fracture: Secondary | ICD-10-CM | POA: Diagnosis not present

## 2021-07-24 DIAGNOSIS — Z0000 Encounter for general adult medical examination without abnormal findings: Secondary | ICD-10-CM | POA: Diagnosis not present

## 2021-07-24 DIAGNOSIS — E7800 Pure hypercholesterolemia, unspecified: Secondary | ICD-10-CM | POA: Diagnosis not present

## 2021-07-24 DIAGNOSIS — I10 Essential (primary) hypertension: Secondary | ICD-10-CM | POA: Diagnosis not present

## 2021-07-24 DIAGNOSIS — M810 Age-related osteoporosis without current pathological fracture: Secondary | ICD-10-CM | POA: Diagnosis not present

## 2021-08-03 DIAGNOSIS — J310 Chronic rhinitis: Secondary | ICD-10-CM | POA: Diagnosis not present

## 2021-08-03 DIAGNOSIS — J322 Chronic ethmoidal sinusitis: Secondary | ICD-10-CM | POA: Diagnosis not present

## 2021-08-03 DIAGNOSIS — J320 Chronic maxillary sinusitis: Secondary | ICD-10-CM | POA: Diagnosis not present

## 2021-09-14 DIAGNOSIS — H2513 Age-related nuclear cataract, bilateral: Secondary | ICD-10-CM | POA: Diagnosis not present

## 2021-09-30 DIAGNOSIS — H2511 Age-related nuclear cataract, right eye: Secondary | ICD-10-CM | POA: Diagnosis not present

## 2021-09-30 DIAGNOSIS — H25811 Combined forms of age-related cataract, right eye: Secondary | ICD-10-CM | POA: Diagnosis not present

## 2021-11-06 ENCOUNTER — Ambulatory Visit: Payer: Medicare Other | Admitting: Internal Medicine

## 2021-11-06 ENCOUNTER — Encounter: Payer: Self-pay | Admitting: Internal Medicine

## 2021-11-06 ENCOUNTER — Other Ambulatory Visit: Payer: Self-pay

## 2021-11-06 VITALS — BP 110/64 | HR 75 | Ht 59.0 in | Wt 85.4 lb

## 2021-11-06 DIAGNOSIS — R9431 Abnormal electrocardiogram [ECG] [EKG]: Secondary | ICD-10-CM | POA: Diagnosis not present

## 2021-11-06 DIAGNOSIS — R002 Palpitations: Secondary | ICD-10-CM | POA: Diagnosis not present

## 2021-11-06 DIAGNOSIS — I10 Essential (primary) hypertension: Secondary | ICD-10-CM | POA: Diagnosis not present

## 2021-11-06 NOTE — Patient Instructions (Signed)
Medication Instructions:  ?NO CHANGES ? ?*If you need a refill on your cardiac medications before your next appointment, please call your pharmacy* ? ? ?Follow-Up: ?At [facility], you and your health needs are our priority.  As part of our continuing mission to provide you with exceptional heart care, we have created designated Provider Care Teams.  These Care Teams include your primary Cardiologist (physician) and Advanced Practice Providers (APPs -  Physician Assistants and Nurse Practitioners) who all work together to provide you with the care you need, when you need it. ? ?We recommend signing up for the patient portal called "MyChart".  Sign up information is provided on this After Visit Summary.  MyChart is used to connect with patients for Virtual Visits (Telemedicine).  Patients are able to view lab/test results, encounter notes, upcoming appointments, etc.  Non-urgent messages can be sent to your provider as well.   ?To learn more about what you can do with MyChart, go to [email or web address].   ? ?Your next appointment:   ? ?1 year with Dr. Hilty  ?

## 2021-11-06 NOTE — Progress Notes (Signed)
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 She had a complaint of chest heaviness at her last visit however that resolved and she's had no further episodes. She is continuing to be active.  Unfortunately, she recently suffered a torn left meniscus in her knee. She subsequently underwent arthroscopic surgery and has had marked improvement in her exercise ability. She underwent physical therapy and is now getting back to normal. Blood pressure remains well-controlled on low-dose amlodipine.  I had the pleasure of seeing Danielle [name] back in the office today.  She continues to do extremely well. She exercises regularly and denies any chest pain or worsening shortness of breath. Blood pressures been well controlled.  I reviewed recent laboratory work from her primary care provider which shows excellent cholesterol control and otherwise all labs are within normal limits.  At the pleasure see Danielle [name] here today in follow-up. She recent called in the office and noted that she's had some labile blood pressures. Her [facility] to [facility]. When they filled her prescription she noted that she had a different generic for amlodipine. She said after taking that for 8-10 doses she felt some tightness in her chest, palpitations and noted her blood pressure gone up to 180. She then stopped the medicine and took some of her old prescription that she found. She called the office and requested a prescription for her old medication. She then switched to that with an improvement in blood pressure however she still has some symptoms and doesn't feel quite as well as she did several weeks ago. She reported the palpitations felt like a hard pounding in her chest. Her last stress test was in 2011 and  negative for ischemia.  06/11/2016  Danielle [name] returns today for follow-up. In April she had some palpitations and discomfort in her cst. She underwent a repea ischemia. She wore a monitor which showed some occasional PACs and PVCs but no A. Fib or other arrhythmias. [facility] and has noted no problems with her generic amlodipine. Blood pressure is well-controlled today.  07/14/2017  Danielle [name] returns today for annual follow-up.  She denies any worsening palpitations, dizziness, chest pain or shortness of breath.  She continues to exercise.  She is mostly struggling with some jaw pain after a recent root canal.  She says she is scheduled to have a CT scan to further investigate that.  From a cardiac standpoint however she seems to be fairly stable.  Blood pressure remains well controlled on amlodipine.  She also had recent labs at Dr. Elmyra Ricks office which were all within normal limits.  Her serum glucose was 99, serum creatinine 0.7, hemoglobin A1c 5.4 and stable in total control 215, HDL 85, LDL 112 and triglycerides of 90.  07/14/2018  Danielle [name] seen today for annual follow-up.  I saw her exactly a year ago.  She was having some problems with jaw pain.  Ultimately she was found to have a cracked tooth and underwent to root canal.  Her symptoms improved after that.  She was also having some sinus congestion.  Because of her mouth pain she actually had trouble eating and has lost more weight.  Her BMI now is 18.  EKG today is mildly abnormal showing sinus rhythm with LVH  by voltage and some nonspecific lateral T wave changes.  This was not present last year, however may represent a strain pattern.  She is completely asymptomatic.  She continues with silver sneakers and exercises several times a week without symptoms.  07/23/2019  Danielle [name] returns today for follow-up.  Overall she is doing well.  She recently had surgery in June which included a nasal septoplasty,  bilateral partial inferior turbinate resection, tissue removal and removal of a maxillary fungus ball.  Overall she has had significant improvement in her breathing and has her smell improved.  All this may have stemmed from a root canal which apparently punctured into her sinus.  It is noted that her blood pressure is elevated today.  We rechecked that it was 164/95.  She has a blood pressure cuff but is more than 85 years old.  I encouraged her to get a new cuff for more accuracy.  She is advised then to follow blood pressures over the next few weeks.  09/18/2020  Danielle [name] is seen today for follow-up.  Overall she continues to do well.  She is exercising regularly again.  Her weight got a little low but it started to come back up.  Blood pressure is well controlled.  Lipids were performed by her PCP in November showing total cholesterol 219, HDL high at 81, LDL 125 and triglycerides 72.  Overall felt to be a favorable lipid profile.  11/06/2021  Danielle [name] is seen in the office today for follow-up.  Blood pressure has been reasonably well controlled.  Initially was 140/78 however I rechecked it manually came down to 110/64.  EKG shows a normal sinus rhythm.  She denies any chest pain or worsening shortness of breath.  She continues to exercise regularly participating in the [employer] program  PMHx:  Past Medical History:  Diagnosis Date   Atypical chest pain    Dyslipidemia    History of nuclear stress test 03/18/2010   dipyridamole; normal pattern of perfusion, low risk, normal study    Hypertension     Past Surgical History:  Procedure Laterality Date   DILATION AND CURETTAGE OF UTERUS     ETHMOIDECTOMY Right 02/20/2019   Procedure: RIGHT TOTAL ETHMOIDECTOMY;  Surgeon: Newman Pies, MD;  Location: [facility];  Service: ENT;  Laterality: Right;   MAXILLARY ANTROSTOMY N/A 02/20/2019   Procedure: ENDOSCOOPIC MAXILLARY ANTROSTOMY WITH TISSUE REMOVAL;  Surgeon: Newman Pies, MD;   Location: [facility];  Service: ENT;  Laterality: N/A;   NASAL SEPTOPLASTY W/ TURBINOPLASTY Bilateral 02/20/2019   Procedure: NASAL SEPTOPLASTY WITH  BILATERAL TURBINATE REDUCTION;  Surgeon: Newman Pies, MD;  Location: [facility];  Service: ENT;  Laterality: Bilateral;   SINUS ENDO WITH FUSION N/A 02/20/2019   Procedure: SINUS ENDO WITH FUSION;  Surgeon: Newman Pies, MD;  Location: [facility];  Service: ENT;  Laterality: N/A;   TUBAL LIGATION      FAMHx:  Family History  Problem Relation Age of Onset   Cancer Father    Heart Problems Brother    Hypertension Brother     SOCHx:   reports that she has never smoked. She has never used smokeless tobacco. She reports current alcohol use of about 1.0 standard drink per week. She reports that she does not use drugs.  ALLERGIES:  No Known Allergies  ROS: Pertinent items noted in HPI and remainder of comprehensive ROS otherwise negative.  HOME MEDS: Current Outpatient Medications  Medication Sig Dispense Refill   acetaminophen (TYLENOL) 500 MG tablet Take 1,000 mg by mouth at bedtime as needed.     amLODipine (NORVASC) 5 MG tablet TAKE 1 TABLET BY MOUTH EVERY DAY 90 tablet 3   b complex vitamins tablet Take 1 tablet by mouth daily.     chlorpheniramine (CHLOR-TRIMETON) 4 MG tablet Take 4 mg by mouth 2 (two) times daily as needed for allergies.     Cholecalciferol (VITAMIN D-3 PO) Take 1 tablet by mouth daily.      magnesium 30 MG tablet Take 30 mg by mouth 2 (two) times daily.     Multiple Vitamins-Minerals (MULTIVITAMIN PO) Take by mouth daily.     Potassium (POTASSIMIN PO) Take 1 tablet by mouth daily.     Turmeric (QC TUMERIC COMPLEX PO) Take by mouth.     No current facility-administered medications for this visit.    LABS/IMAGING: No results found for this or any previous visit (from the past 48 hour(s)). No results found.  VITALS: BP 140/78    Pulse 75    Ht 4\' 11"  (1.499 m)    Wt 85 lb  6.4 oz (38.7 kg)    SpO2 98%    BMI 17.25 kg/m   EXAM: General appearance: alert and no distress Neck: no adenopathy, no carotid bruit, no JVD, supple, symmetrical, trachea midline and thyroid not enlarged, symmetric, no tenderness/mass/nodules Lungs: clear to auscultation bilaterally Heart: regular rate and rhythm, S1, S2 normal, no murmur, click, rub or gallop Abdomen: soft, non-tender; bowel sounds normal; no masses,  no organomegaly Extremities: extremities normal, atraumatic, no cyanosis or edema Pulses: 2+ and symmetric Skin: Skin color, texture, turgor normal. No rashes or lesions Neurologic: Grossly normal  EKG: This rhythm at 75, possible left atrial enlargement, LVH by voltage -personally reviewed  ASSESSMENT: Chest pain - low risk GXT 12/2015 Palpitations - occasional PAC's/PVC's Hypertension-controlled LVH with lateral T wave changes -suspect that high voltages are due to her being very thin  PLAN: 1.   Mrs. Dantuono continues to do well.  No worsening chest pain or shortness of breath.  Good blood pressure control.  Although she has LVH, I think this is related to her body habitus.  She denies any PVCs or palpitations.  Blood pressure is well controlled.  She continues to exercise without any limitation.  Plan follow-up with me annually or sooner as necessary.  Chrystie Nose, MD, [facility], FACP  [place]   [facility]  Medical Director of the Advanced Lipid Disorders &  Cardiovascular Risk Reduction Clinic Diplomate of the American Board of Clinical Lipidology Attending Cardiologist  Direct Dial: [phone]   Fax: [phone]  Website:  [email or web address] Nicely Coti Burd 11/06/2021, 9:11 AM

## 2021-11-18 DIAGNOSIS — H25812 Combined forms of age-related cataract, left eye: Secondary | ICD-10-CM | POA: Diagnosis not present

## 2021-11-18 DIAGNOSIS — H2512 Age-related nuclear cataract, left eye: Secondary | ICD-10-CM | POA: Diagnosis not present

## 2022-01-12 DIAGNOSIS — L820 Inflamed seborrheic keratosis: Secondary | ICD-10-CM | POA: Diagnosis not present

## 2022-02-02 DIAGNOSIS — L820 Inflamed seborrheic keratosis: Secondary | ICD-10-CM | POA: Diagnosis not present

## 2022-06-28 DIAGNOSIS — Z961 Presence of intraocular lens: Secondary | ICD-10-CM | POA: Diagnosis not present

## 2022-06-28 DIAGNOSIS — H04123 Dry eye syndrome of bilateral lacrimal glands: Secondary | ICD-10-CM | POA: Diagnosis not present

## 2022-07-28 DIAGNOSIS — R7301 Impaired fasting glucose: Secondary | ICD-10-CM | POA: Diagnosis not present

## 2022-07-28 DIAGNOSIS — E7800 Pure hypercholesterolemia, unspecified: Secondary | ICD-10-CM | POA: Diagnosis not present

## 2022-07-28 DIAGNOSIS — I10 Essential (primary) hypertension: Secondary | ICD-10-CM | POA: Diagnosis not present

## 2022-07-28 DIAGNOSIS — M810 Age-related osteoporosis without current pathological fracture: Secondary | ICD-10-CM | POA: Diagnosis not present

## 2022-09-13 DIAGNOSIS — Z0000 Encounter for general adult medical examination without abnormal findings: Secondary | ICD-10-CM | POA: Diagnosis not present

## 2022-09-13 DIAGNOSIS — E7800 Pure hypercholesterolemia, unspecified: Secondary | ICD-10-CM | POA: Diagnosis not present

## 2022-09-13 DIAGNOSIS — M810 Age-related osteoporosis without current pathological fracture: Secondary | ICD-10-CM | POA: Diagnosis not present

## 2022-09-13 DIAGNOSIS — R200 Anesthesia of skin: Secondary | ICD-10-CM | POA: Diagnosis not present

## 2022-09-13 DIAGNOSIS — I10 Essential (primary) hypertension: Secondary | ICD-10-CM | POA: Diagnosis not present

## 2022-09-21 DIAGNOSIS — K080 Exfoliation of teeth due to systemic causes: Secondary | ICD-10-CM | POA: Diagnosis not present

## 2023-03-28 DIAGNOSIS — K080 Exfoliation of teeth due to systemic causes: Secondary | ICD-10-CM | POA: Diagnosis not present

## 2023-04-19 DIAGNOSIS — K080 Exfoliation of teeth due to systemic causes: Secondary | ICD-10-CM | POA: Diagnosis not present

## 2023-09-08 DIAGNOSIS — R7309 Other abnormal glucose: Secondary | ICD-10-CM | POA: Diagnosis not present

## 2023-09-08 DIAGNOSIS — R7989 Other specified abnormal findings of blood chemistry: Secondary | ICD-10-CM | POA: Diagnosis not present

## 2023-09-08 DIAGNOSIS — E559 Vitamin D deficiency, unspecified: Secondary | ICD-10-CM | POA: Diagnosis not present

## 2023-09-08 DIAGNOSIS — E7800 Pure hypercholesterolemia, unspecified: Secondary | ICD-10-CM | POA: Diagnosis not present

## 2023-09-09 DIAGNOSIS — H524 Presbyopia: Secondary | ICD-10-CM | POA: Diagnosis not present

## 2023-09-15 DIAGNOSIS — M810 Age-related osteoporosis without current pathological fracture: Secondary | ICD-10-CM | POA: Diagnosis not present

## 2023-09-15 DIAGNOSIS — Z0000 Encounter for general adult medical examination without abnormal findings: Secondary | ICD-10-CM | POA: Diagnosis not present

## 2023-09-15 DIAGNOSIS — R413 Other amnesia: Secondary | ICD-10-CM | POA: Diagnosis not present

## 2023-10-17 DIAGNOSIS — K080 Exfoliation of teeth due to systemic causes: Secondary | ICD-10-CM | POA: Diagnosis not present

## 2023-12-21 DIAGNOSIS — M810 Age-related osteoporosis without current pathological fracture: Secondary | ICD-10-CM | POA: Insufficient documentation

## 2023-12-21 DIAGNOSIS — R413 Other amnesia: Secondary | ICD-10-CM | POA: Insufficient documentation

## 2023-12-22 ENCOUNTER — Ambulatory Visit: Payer: Medicare Other | Admitting: Neurology

## 2023-12-22 ENCOUNTER — Encounter: Payer: Self-pay | Admitting: Neurology

## 2023-12-22 VITALS — BP 133/80 | HR 75 | Ht 59.5 in | Wt 84.0 lb

## 2023-12-22 DIAGNOSIS — Z681 Body mass index (BMI) 19 or less, adult: Secondary | ICD-10-CM

## 2023-12-22 DIAGNOSIS — Z789 Other specified health status: Secondary | ICD-10-CM

## 2023-12-22 DIAGNOSIS — R413 Other amnesia: Secondary | ICD-10-CM | POA: Diagnosis not present

## 2023-12-22 DIAGNOSIS — Z820 Family history of epilepsy and other diseases of the nervous system: Secondary | ICD-10-CM

## 2023-12-22 NOTE — Patient Instructions (Signed)
 It was nice to meet you today.  You have complaints of memory loss: memory loss or changes in cognitive function can have many reasons and does not always mean you have dementia.  There are several conditions and situations that can contribute to subjective or objective memory loss.  These factors include: depression, stress, sleep deprivation or poor sleep from insomnia or sleep apnea, dehydration, fluctuation in blood sugar values, thyroid or electrolyte dysfunction, medication effects from sedating medications or narcotic pain medication for example and certain vitamin deficiencies such as vitamin B12 deficiency, and anemia. Dementia can be caused by stroke, brain atherosclerosis or brain vascular disease due to vascular risk factors (smoking, high blood pressure, high cholesterol, obesity and uncontrolled diabetes), certain degenerative brain disorders (including Parkinson's disease and Multiple sclerosis) and by Alzheimer's disease or other, more rare and sometimes hereditary causes.   Here is what I would recommend:   blood work (which we will do today).  We can consider a brain scan, called MRI, let me know, if you wish to pursue this.  I am somewhat concerned about your driving, please have your family monitor it and I would suggest at this point only local roads, familiar routes, no nighttime and no highway driving.  We may consider medication for memory loss.   Please increase your water intake to 6-8 cups/day, 8 oz size each.  Limit your alcohol intake to less than 1 glass per day.

## 2023-12-22 NOTE — Progress Notes (Signed)
 Subjective:    Patient ID: Danielle [name] is a 88 y.o. female.  HPI    Huston Foley, MD, PhD [facility] [address], [address] [address] [place], [place] [address]  Dear Dr. Thomasena Edis,  I saw your patient, Danielle [name], upon your kind request in my  neurologic clinic today for initial consultation of her memory loss.  The patient is accompanied by her daughter, [name], today.  As you know, Ms. Ingman is a 89 year old female with an underlying medical history of hypertension, hyperlipidemia, vitamin D deficiency,, osteoporosis, abnormal LFTs, atypical chest pain, prediabetes, and low BMI, who reports an approximately 1 year history of forgetfulness.  The patient is not particularly concerned about her memory but daughter has noticed some short-term memory issues which she has mostly attributed to normal aging.  Nevertheless, she wanted mom to get checked out.  Patient reports being divorced and she lives alone, daughter lives in [place].  Patient drives typically locally within the 2 to 5 mile radius but has driven to [facility] as well and avoids peak traffic hours.  She has not had any strokelike symptoms and denies any recurrent headaches.  Her only mom lived to be around 61 and had some memory issues later in life.  Her dad died in his 12s from lung cancer.  She has an older brother who is about 58 years old.  She lost her younger brother in his early 77s from heart disease.  She exercises regularly, goes to the gym about 3 days a week, walks about 3 miles 3 times a week.  She has not fallen recently.  She does not eat very much and does not always hydrate well with water.  She estimates that she drinks less than 4, definitely less than 6 cups of water per day on an average day.  She does drink wine every day, 1 or 2 glasses typically.  She drinks 1 cup of coffee in the morning.  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 as  well.  She has 1 daughter who is in her 2s.   I reviewed your office note from 09/15/2023. She had blood work through your office on 09/08/2023 and I was able to review results in her paper chart.  Lipid panel showed a total cholesterol of 248, triglycerides elevated at 159, LDL elevated at 135, CBC with differential and platelets benign, CMP showed blood sugar of 123, BUN 10, creatinine 0.75, albumin above normal at 4.9, AST 32, ALT 21, A1c in the prediabetes range at 5.9, vitamin D level 43.8, TSH normal at 2.63. She had a head CT without contrast through [facility] emergency room on 12/20/2018 with indication of recent fall with headaches and neck pain, initial encounter.  I reviewed the results:   IMPRESSION: Chronic atrophic and ischemic changes without acute abnormality.   In addition, I personally and independently reviewed images through the PACS system.  I noticed generalized atrophy and periventricular white matter changes.    Her Past Medical History Is Significant For: Past Medical History:  Diagnosis Date   Atypical chest pain    Dyslipidemia    History of nuclear stress test 03/18/2010   dipyridamole; normal pattern of perfusion, low risk, normal study    Hypertension     Her Past Surgical History Is Significant For: Past Surgical History:  Procedure Laterality Date   DILATION AND CURETTAGE OF UTERUS  ETHMOIDECTOMY Right 02/20/2019   Procedure: RIGHT TOTAL ETHMOIDECTOMY;  Surgeon: Reynold Caves, MD;  Location: [facility];  Service: ENT;  Laterality: Right;   knee surgery torn meniscus Left    MAXILLARY ANTROSTOMY N/A 02/20/2019   Procedure: ENDOSCOOPIC MAXILLARY ANTROSTOMY WITH TISSUE REMOVAL;  Surgeon: Reynold Caves, MD;  Location: [facility];  Service: ENT;  Laterality: N/A;   NASAL SEPTOPLASTY W/ TURBINOPLASTY Bilateral 02/20/2019   Procedure: NASAL SEPTOPLASTY WITH  BILATERAL TURBINATE REDUCTION;  Surgeon: Reynold Caves, MD;  Location: [facility];  Service: ENT;  Laterality: Bilateral;   SINUS ENDO WITH FUSION N/A 02/20/2019   Procedure: SINUS ENDO WITH FUSION;  Surgeon: Reynold Caves, MD;  Location: [facility];  Service: ENT;  Laterality: N/A;   TUBAL LIGATION      Her Family History Is Significant For: Family History  Problem Relation Age of Onset   Stroke Mother    Dementia Mother    Cancer Father    Heart Problems Brother    Hypertension Brother     Her Social History Is Significant For: Social History   Socioeconomic History   Marital status: Divorced    Spouse name: Not on file   Number of children: Not on file   Years of education: Not on file   Highest education level: Bachelor's degree (e.g., BA, AB, BS)  Occupational History   Not on file  Tobacco Use   Smoking status: Never   Smokeless tobacco: Never  Vaping Use   Vaping status: Never Used  Substance and Sexual Activity   Alcohol use: Yes    Alcohol/week: 7.0 standard drinks of alcohol    Types: 7 Glasses of wine per week   Drug use: No   Sexual activity: Not on file  Other Topics Concern   Not on file  Social History Narrative   Exercises at [employer]. Meditation & yoga.       Lives alone   Right handed   Caffeine: coffee 1 cup AM   Social Drivers of Corporate investment banker Strain: Not on file  Food Insecurity: Not on file  Transportation Needs: Not on file  Physical Activity: Not on file  Stress: Not on file  Social Connections: Not on file    Her Allergies Are:  No Known Allergies:   Her Current Medications Are:  Outpatient Encounter Medications as of 12/22/2023  Medication Sig   acetaminophen (TYLENOL) 500 MG tablet Take 1,000 mg by mouth at bedtime as needed.   amLODipine (NORVASC) 5 MG tablet TAKE 1 TABLET BY MOUTH EVERY DAY   b complex vitamins tablet Take 1 tablet by mouth daily.   chlorpheniramine (CHLOR-TRIMETON) 4 MG tablet Take 4 mg by mouth 2 (two) times daily as needed for allergies.    Cholecalciferol (VITAMIN D-3 PO) Take 1 tablet by mouth daily.    magnesium 30 MG tablet Take 30 mg by mouth 2 (two) times daily.   Multiple Vitamins-Minerals (MULTIVITAMIN PO) Take by mouth daily.   Potassium (POTASSIMIN PO) Take 1 tablet by mouth daily.   Turmeric (QC TUMERIC COMPLEX PO) Take by mouth.   No facility-administered encounter medications on file as of 12/22/2023.  :   Review of Systems:  Out of a complete 14 point review of systems, all are reviewed and negative with the exception of these symptoms as listed below:  Review of Systems  Neurological:        Patient is here with  her daughter for new evaluation of memory concerns. Patient states she doesn't believe she has trouble with her memory but states her daughter does. Patient lives alone and reports that she does ADLs, etc all independently. Daughter states she has noticed some short term memory trouble, ?normal decline. Daughter states patient has never had good long term memory. She drives. Her mother had dementia. MOCA 22/30.    Objective:  Neurological Exam  Physical Exam Physical Examination:   Vitals:   12/22/23 0807  BP: 133/80  Pulse: 75    General Examination: The patient is a very pleasant 88 y.o. female in no acute distress. She appears frail and very slender.  Well-groomed.  Good spirits.   HEENT: Normocephalic, atraumatic, pupils are equal, round and reactive to light, extraocular tracking is good without limitation to gaze excursion or nystagmus noted.  Status post bilateral cataract repairs.  Hearing is grossly intact. Face is symmetric with normal facial animation. Speech is clear with no dysarthria noted. There is no hypophonia. There is no lip, neck/head, jaw or voice tremor. Neck is supple with full range of passive and active motion. There are no carotid bruits on auscultation. Oropharynx exam reveals: moderate mouth dryness, adequate dental hygiene and mild airway crowding, due to small airway  entry. Tongue protrudes centrally and palate elevates symmetrically.  Chest: Clear to auscultation without wheezing, rhonchi or crackles noted.  Heart: S1+S2+0, regular and normal without murmurs, rubs or gallops noted.   Abdomen: Soft, non-tender and non-distended.  Extremities: There is no pitting edema in the distal lower extremities bilaterally.   Skin: Warm and dry without trophic changes noted.   Musculoskeletal: exam reveals no obvious joint deformities.   Neurologically:  Mental status: The patient is awake, alert and oriented in all 4 spheres. Her immediate and remote memory, attention, language skills and fund of knowledge are fairly appropriate.  Her daughter provides some additional details and supplements history.  There is no evidence of aphasia, agnosia, apraxia or anomia. Speech is clear with normal prosody and enunciation. Thought process is linear. Mood is normal and affect is normal.      12/22/2023    8:11 AM  Montreal Cognitive Assessment   Visuospatial/ Executive (0/5) 4  Naming (0/3) 3  Attention: Read list of digits (0/2) 1  Attention: Read list of letters (0/1) 1  Attention: Serial 7 subtraction starting at 100 (0/3) 2  Language: Repeat phrase (0/2) 1  Language : Fluency (0/1) 0  Abstraction (0/2) 2  Delayed Recall (0/5) 2  Orientation (0/6) 6  Total 22  Adjusted Score (based on education) 22    Cranial nerves II - XII are as described above under HEENT exam.  Motor exam: Thin bulk, strength normal, and normal tone is noted. There is no obvious action or resting tremor.  No drift or rebound, no postural or intention tremor. Fine motor skills and coordination: grossly intact.  Cerebellar testing: No dysmetria or intention tremor. There is no truncal or gait ataxia.  Normal finger-to-nose. Reflexes 1+ in the upper extremities and trace in the knees, absent in the ankles. Sensory exam: intact to light touch in the upper and lower extremities.  Gait,  station and balance: She stands easily. No veering to one side is noted. No leaning to one side is noted. Posture is age-appropriate and stance is narrow based. Gait shows normal stride length and normal pace. No problems turning are noted.   Assessment and Plan:  In summary, Patrici Kyser is an  88 year old female with an underlying medical history of hypertension, hyperlipidemia, vitamin D deficiency,, osteoporosis, abnormal LFTs, atypical chest pain, prediabetes, and low BMI, who presents for evaluation of her short-term memory problems of several months duration.  She has multiple vascular risk factors.  She has an abnormal MoCA score in the mildly abnormal range at this time; findings may be in the age-appropriate range also.  She had recent workup with blood work through your office but I would like to add a vitamin B12 level today.  We talked about her driving, she is advised to be cautious with her driving and limit herself to local roads and short distances only at this time.  Her daughter is encouraged to monitor her driving as well and we can always consider a formal driver's evaluation in the future.  We talked about imaging testing, we mutually agreed to forego a brain MRI at this time but we can certainly consider this if the patient changes her mind.  Her history is not alarming, family history not telltale for Alzheimer's dementia.  We talked about modification of lifestyle and monitoring hyperlipidemia, it sounds like she has tried cholesterol medication but had side effects or wanted to work on reducing her cholesterol level with diet.  She is encouraged to stay better hydrated with water and increase her protein intake.  We will keep her posted as to her blood test results and mutually agreed to avoid any new medications at this time as she is not keen to add any memory medication in particular.  She is advised to scale back on her daily alcohol consumption to less than 1 glass of wine per day  on average.  She is currently not very keen on cutting back.  She is commended for her physical fitness.  Of note, she lives alone in a double story home but her bedroom is on the main level.  We will check back in with her in about 6 months, she is advised that we will call her with her blood test results in the meantime and if she would like to pursue a brain MRI I would be happy to order one.  I answered all the questions today and the patient and her daughter were in agreement.  This was an extended visit of over 60 minutes with memory testing and interpretation involved, and electronic as well as paper record review involved and considerable counseling and coordination of care.   Thank you very much for allowing me to participate in the care of this nice patient. If I can be of any further assistance to you please do not hesitate to call me at [phone].  Sincerely,   Debbra Fairy, MD, PhD

## 2023-12-26 LAB — VITAMIN B1: Thiamine: 119 nmol/L (ref 66.5–200.0)

## 2023-12-26 LAB — B12 AND FOLATE PANEL
Folate: >20 ng/mL (ref >3.0)
Vitamin B-12: >2000 pg/mL — ABNORMAL HIGH (ref 232–1245)

## 2023-12-26 LAB — VITAMIN B6: Vitamin B6: 68.5 ug/L — ABNORMAL HIGH (ref 3.4–65.2)

## 2023-12-28 ENCOUNTER — Telehealth: Payer: Self-pay | Admitting: *Deleted

## 2023-12-28 NOTE — Telephone Encounter (Signed)
-----   Message from Debbra Fairy sent at 12/26/2023  2:58 PM EDT ----- Please call patient or her daughter on and and advise them that her Vitamin B12 level and vitamin B6 level are both elevated.  If she is taking any vitamin B12 or B6 supplements or a B complex supplement, I would recommend that she stop it.  She can always get her vitamin B12 rechecked with her PCP in about 3 months.  Vitamin B1 was within normal range.

## 2023-12-28 NOTE — Telephone Encounter (Signed)
 Relayed results of labs to pt.  She will stop B12 tablet (extra) that she has and confer with pcp about her MVI.  She appreciated call back.  Mailed results to her.

## 2024-01-02 DIAGNOSIS — H02834 Dermatochalasis of left upper eyelid: Secondary | ICD-10-CM | POA: Diagnosis not present

## 2024-01-02 DIAGNOSIS — H02831 Dermatochalasis of right upper eyelid: Secondary | ICD-10-CM | POA: Diagnosis not present

## 2024-01-04 ENCOUNTER — Telehealth: Payer: Self-pay | Admitting: Neurology

## 2024-01-04 NOTE — Telephone Encounter (Signed)
 Appointment details confirmed

## 2024-05-03 DIAGNOSIS — K080 Exfoliation of teeth due to systemic causes: Secondary | ICD-10-CM | POA: Diagnosis not present

## 2024-06-06 ENCOUNTER — Ambulatory Visit: Admitting: Neurology

## 2024-06-06 ENCOUNTER — Encounter: Payer: Self-pay | Admitting: Neurology

## 2024-06-06 VITALS — BP 147/73 | HR 88 | Ht <= 58 in | Wt 82.4 lb

## 2024-06-06 DIAGNOSIS — Z681 Body mass index (BMI) 19 or less, adult: Secondary | ICD-10-CM

## 2024-06-06 DIAGNOSIS — Z820 Family history of epilepsy and other diseases of the nervous system: Secondary | ICD-10-CM

## 2024-06-06 DIAGNOSIS — Z789 Other specified health status: Secondary | ICD-10-CM | POA: Diagnosis not present

## 2024-06-06 DIAGNOSIS — R413 Other amnesia: Secondary | ICD-10-CM

## 2024-06-06 NOTE — Progress Notes (Signed)
 Subjective:    Patient ID: Danielle [name] is a 88 y.o. female.  HPI   Interim history:   Danielle [name] is an 88 year old female with an underlying medical history of hypertension, hyperlipidemia, vitamin D deficiency,, osteoporosis, abnormal LFTs, atypical chest pain, prediabetes, and low BMI, who presents for follow-up consultation of her memory loss.  The patient is accompanied by her daughter again today.  I first met her at the request of her primary care physician on 12/22/2023, at which time she reported memory loss for about 1 year.  Her MoCA score was 22/30 at the time.  Her daughter was encouraged to monitor her driving.  She was advised to cut back on her daily alcohol consumption.  We talked about pursuing a brain MRI but we agreed to hold off for now.   We checked some vitamin D levels.  Her B12 was elevated at 2000.  Her vitamin B6 was also elevated, B1 was normal.  She was advised of her test results and encouraged to stop taking any additional B12 and B6 supplements and consult with her PCP regarding which multivitamin or vitamins she should take.  Today, 06/06/2024: She reports overall doing fairly well, she did have some stress this morning as she drove herself to this appointment which was not originally the plan, her daughter was supposed to come but daughter admits that she got out a little late from [place] and ended up coming separately.  Patient usually limits her driving to 3 common places she goes to locally within a couple of miles radius.  Nevertheless she mated okay.  She has been hydrating a little better.  She has cut back on her alcohol consumption and drinks diluted wine about 3 times a week.  They have been focusing on nutrition and she has increased her cottage cheese and [place] yogurt intake and likes to eat egg bites.  No new concerns or complaints.  The patient's allergies, current medications, family history, past medical history, past social history, past surgical  history and problem list were reviewed and updated as appropriate.   Previously:  12/22/2023: (She) reports an approximately 1 year history of forgetfulness.  The patient is not particularly concerned about her memory but daughter has noticed some short-term memory issues which she has mostly attributed to normal aging.  Nevertheless, she wanted mom to get checked out.  Patient reports being divorced and she lives alone, daughter lives in [place].  Patient drives typically locally within the 2 to 5 mile radius but has driven to [facility] as well and avoids peak traffic hours.  She has not had any strokelike symptoms and denies any recurrent headaches.  Her only mom lived to be around 95 and had some memory issues later in life.  Her dad died in his 53s from lung cancer.  She has an older brother who is about 2 years old.  She lost her younger brother in his early 69s from heart disease.  She exercises regularly, goes to the gym about 3 days a week, walks about 3 miles 3 times a week.  She has not fallen recently.  She does not eat very much and does not always hydrate well with water.  She estimates that she drinks less than 4, definitely less than 6 cups of water per day on an average day.  She does drink wine every day, 1 or 2 glasses typically.  She drinks 1 cup of coffee in the morning.  She has worked in a Social worker  office, she has worked as a Runner, broadcasting/film/video and she has worked for the [employer] as well.  She has 1 daughter who is in her 74s.   I reviewed your office note from 09/15/2023. She had blood work through your office on 09/08/2023 and I was able to review results in her paper chart.  Lipid panel showed a total cholesterol of 248, triglycerides elevated at 159, LDL elevated at 135, CBC with differential and platelets benign, CMP showed blood sugar of 123, BUN 10, creatinine 0.75, albumin above normal at 4.9, AST 32, ALT 21, A1c in the prediabetes range at 5.9, vitamin D level 43.8, TSH normal at 2.63. She had a head CT  without contrast through [facility] emergency room on 12/20/2018 with indication of recent fall with headaches and neck pain, initial encounter.  I reviewed the results:     IMPRESSION: Chronic atrophic and ischemic changes without acute abnormality.   In addition, I personally and independently reviewed images through the PACS system.  I noticed generalized atrophy and periventricular white matter changes.    Her Past Medical History Is Significant For: Past Medical History:  Diagnosis Date   Atypical chest pain    Dyslipidemia    History of nuclear stress test 03/18/2010   dipyridamole; normal pattern of perfusion, low risk, normal study    Hypertension     Her Past Surgical History Is Significant For: Past Surgical History:  Procedure Laterality Date   DILATION AND CURETTAGE OF UTERUS     ETHMOIDECTOMY Right 02/20/2019   Procedure: RIGHT TOTAL ETHMOIDECTOMY;  Surgeon: Karis Clunes, MD;  Location: [facility];  Service: ENT;  Laterality: Right;   knee surgery torn meniscus Left    MAXILLARY ANTROSTOMY N/A 02/20/2019   Procedure: ENDOSCOOPIC MAXILLARY ANTROSTOMY WITH TISSUE REMOVAL;  Surgeon: Karis Clunes, MD;  Location: [facility];  Service: ENT;  Laterality: N/A;   NASAL SEPTOPLASTY W/ TURBINOPLASTY Bilateral 02/20/2019   Procedure: NASAL SEPTOPLASTY WITH  BILATERAL TURBINATE REDUCTION;  Surgeon: Karis Clunes, MD;  Location: [facility];  Service: ENT;  Laterality: Bilateral;   SINUS ENDO WITH FUSION N/A 02/20/2019   Procedure: SINUS ENDO WITH FUSION;  Surgeon: Karis Clunes, MD;  Location: [facility];  Service: ENT;  Laterality: N/A;   TUBAL LIGATION      Her Family History Is Significant For: Family History  Problem Relation Age of Onset   Stroke Mother    Dementia Mother    Cancer Father    Heart Problems Brother    Hypertension Brother     Her Social History Is Significant For: Social History   Socioeconomic History    Marital status: Divorced    Spouse name: Not on file   Number of children: Not on file   Years of education: Not on file   Highest education level: Bachelor's degree (e.g., BA, AB, BS)  Occupational History   Not on file  Tobacco Use   Smoking status: Never   Smokeless tobacco: Never  Vaping Use   Vaping status: Never Used  Substance and Sexual Activity   Alcohol use: Yes    Alcohol/week: 7.0 standard drinks of alcohol    Types: 7 Glasses of wine per week   Drug use: No   Sexual activity: Not on file  Other Topics Concern   Not on file  Social History Narrative   Exercises at [employer]. Meditation & yoga.       Lives alone  Right handed   Caffeine: coffee 1 cup AM   Social Drivers of Corporate investment banker Strain: Not on file  Food Insecurity: Not on file  Transportation Needs: Not on file  Physical Activity: Not on file  Stress: Not on file  Social Connections: Not on file    Her Allergies Are:  No Known Allergies:   Her Current Medications Are:  Outpatient Encounter Medications as of 06/06/2024  Medication Sig   acetaminophen  (TYLENOL ) 500 MG tablet Take 1,000 mg by mouth at bedtime as needed.   amLODipine  (NORVASC ) 5 MG tablet TAKE 1 TABLET BY MOUTH EVERY DAY   b complex vitamins tablet Take 1 tablet by mouth daily.   chlorpheniramine (CHLOR-TRIMETON) 4 MG tablet Take 4 mg by mouth 2 (two) times daily as needed for allergies.   Cholecalciferol (VITAMIN D-3 PO) Take 1 tablet by mouth daily.    magnesium 30 MG tablet Take 30 mg by mouth 2 (two) times daily.   Multiple Vitamins-Minerals (MULTIVITAMIN PO) Take by mouth daily.   Potassium (POTASSIMIN PO) Take 1 tablet by mouth daily.   Turmeric (QC TUMERIC COMPLEX PO) Take by mouth.   No facility-administered encounter medications on file as of 06/06/2024.  :  Review of Systems:  Out of a complete 14 point review of systems, all are reviewed and negative with the exception of these symptoms as listed  below:  Review of Systems  Neurological:        Follow for memory. Doing ok,  she is anxious.    Objective:  Neurological Exam  Physical Exam Physical Examination:   Vitals:   06/06/24 0940  BP: (!) 147/73  Pulse: 88    General Examination: The patient is a very pleasant 88 y.o. female in no acute distress. She appears mildly anxious, otherwise in good spirits, well-groomed.   HEENT: Normocephalic, atraumatic, pupils are equal, tracking well-preserved, face is symmetric.  Speech is clear without dysarthria, hypophonia or voice tremor.   Hearing is grossly intact. Face is symmetric with normal facial animation. Speech is clear with no dysarthria noted. There is no hypophonia. There is no lip, neck/head, jaw or voice tremor. Neck is supple with full range of passive and active motion. There are no carotid bruits on auscultation. Oropharynx exam reveals: Mild mouth dryness, adequate dental hygiene and mild airway crowding, due to small airway entry. Tongue protrudes centrally and palate elevates symmetrically.   Chest: Clear to auscultation without wheezing, rhonchi or crackles noted.   Heart: S1+S2+0, regular and normal without murmurs, rubs or gallops noted.    Abdomen: Soft, non-tender and non-distended.   Extremities: There is no pitting edema in the distal lower extremities bilaterally.    Skin: Warm and dry without trophic changes noted.    Musculoskeletal: exam reveals no obvious joint deformities.    Neurologically:  Mental status: The patient is awake, alert and oriented in all 4 spheres. Her immediate and remote memory, attention, language skills and fund of knowledge are fairly appropriate.  Her daughter provides some additional details and supplements history.  There is no evidence of aphasia, agnosia, apraxia or anomia. Speech is clear with normal prosody and enunciation. Thought process is linear. Mood is normal and affect is normal.      06/06/2024    9:51 AM  12/22/2023    8:11 AM  Montreal Cognitive Assessment   Visuospatial/ Executive (0/5) 5 4  Naming (0/3) 3 3  Attention: Read list of digits (0/2) 2 1  Attention: Read list of letters (0/1) 1 1  Attention: Serial 7 subtraction starting at 100 (0/3) 3 2  Language: Repeat phrase (0/2) 2 1  Language : Fluency (0/1) 0 0  Abstraction (0/2) 2 2  Delayed Recall (0/5) 0 2  Orientation (0/6) 6 6  Total 24 22  Adjusted Score (based on education)  22     Cranial nerves II - XII are as described above under HEENT exam.  Motor exam: Thin bulk, moving all 4 extremities without restriction, no obvious action or resting tremor.   Fine motor skills and coordination: grossly intact.  Cerebellar testing: No dysmetria or intention tremor. There is no truncal or gait ataxia.  Sensory exam: intact to light touch in the upper and lower extremities.  Gait, station and balance: She stands easily. No veering to one side is noted. No leaning to one side is noted. Posture is age-appropriate and stance is narrow based. Gait shows normal stride length and normal pace. No problems turning are noted.    Assessment and Plan:  In summary, Danielle [name] is an 88 year old female with an underlying medical history of hypertension, hyperlipidemia, vitamin D deficiency, osteoporosis, abnormal LFTs, atypical chest pain, prediabetes, and low BMI, who presents for follow-up consultation of her memory issues of several months duration, maybe around a year.  She does have multiple vascular risk factors.  Her MoCA scores are stable or slightly improved even.  She has worked on better hydration and better nutrition in the last few months and has also reduced her alcohol consumption.  We talked about her driving again today.  I would really like to have family monitor it.  Her daughter reports that generally her driving is fine but she did get lost on the way of here today and was not even supposed to come by herself but decided to ride  on her own because of otherwise being late for this appointment.  She has increased her water intake and limits her alcohol to about 3 times a week, diluted wine with ice.  Nevertheless, I counseled her again on regular alcohol consumption and encouraged her to limit her regular alcohol consumption even further.  She is reminded to stay well-hydrated and well rested and continue to work on nutrition and at least maintaining weight, if not increasing it a little.  She does not wish to try any new medications which I understand.  We talked about proceeding with a brain MRI at this time but she would like to hold off which I also understand.   We will check back in with her in about 6 months, she is advised to schedule a routine appointment with one of our nurse practitioners.  I answered all their questions today and the patient and her daughter were in agreement with our plan.  They are encouraged to call with any interim questions or concerns.  On the way out today, patient will follow daughter driving her own car.  I spent 30 minutes in total face-to-face time and in reviewing records during pre-charting, more than 50% of which was spent in counseling and coordination of care, reviewing test results, reviewing medications and treatment regimen and/or in discussing or reviewing the diagnosis of memory loss, the prognosis and treatment options. Pertinent laboratory and imaging test results that were available during this visit with the patient were reviewed by me and considered in my medical decision making (see chart for details).

## 2024-06-06 NOTE — Patient Instructions (Signed)
 It was nice to see you both again today.  Your memory scores have been stable.  As discussed, I would be happy to order a brain MRI to look for structural changes.  Let us  know if you would like a scan before your next appointment.  Continue to work on good nutrition and optimal hydration with water and avoid drinking alcohol on a regular basis.  Follow-up in about 6 months to see one of our nurse practitioners.

## 2024-06-25 ENCOUNTER — Ambulatory Visit: Admitting: Neurology

## 2024-12-13 ENCOUNTER — Ambulatory Visit: Admitting: Adult Health
